# Patient Record
Sex: Male | Born: 1985 | Race: White | Hispanic: No | Marital: Single | Smoking: Current every day smoker
Health system: Southern US, Community
[De-identification: ages and names within clinical notes are randomized; demographics above are authoritative.]

## PROBLEM LIST (undated history)

## (undated) DIAGNOSIS — S0285XA Fracture of orbit, unspecified, initial encounter for closed fracture: Secondary | ICD-10-CM

## (undated) DIAGNOSIS — L0291 Cutaneous abscess, unspecified: Secondary | ICD-10-CM

## (undated) DIAGNOSIS — Z720 Tobacco use: Secondary | ICD-10-CM

## (undated) HISTORY — PX: TUMOR REMOVAL: SHX12

---
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
## 2008-11-08 ENCOUNTER — Emergency Department: Payer: Self-pay | Admitting: Internal Medicine

## 2008-12-24 ENCOUNTER — Emergency Department: Payer: Self-pay | Admitting: Emergency Medicine

## 2009-01-26 ENCOUNTER — Emergency Department: Payer: Self-pay | Admitting: Emergency Medicine

## 2009-03-03 ENCOUNTER — Emergency Department: Payer: Self-pay | Admitting: Unknown Physician Specialty

## 2009-04-29 ENCOUNTER — Emergency Department (HOSPITAL_COMMUNITY): Admission: EM | Admit: 2009-04-29 | Discharge: 2009-04-30 | Payer: Self-pay | Admitting: Emergency Medicine

## 2009-04-29 ENCOUNTER — Emergency Department: Payer: Self-pay | Admitting: Emergency Medicine

## 2009-05-15 ENCOUNTER — Emergency Department (HOSPITAL_COMMUNITY): Admission: EM | Admit: 2009-05-15 | Discharge: 2009-05-15 | Payer: Self-pay | Admitting: Emergency Medicine

## 2009-12-03 IMAGING — US US PELVIS LIMITED
1 series · 17 of 25 positions shown · non-contrast
Comparison: none

REASON FOR EXAM: SUDDEN ONSET PAIN L TESTICLE WHILE LIFTING NO HERNIA ON
EXAM
COMMENTS:

[Series 1: us pelvis limited · 17 of 48 slices shown]
[im 1/48]
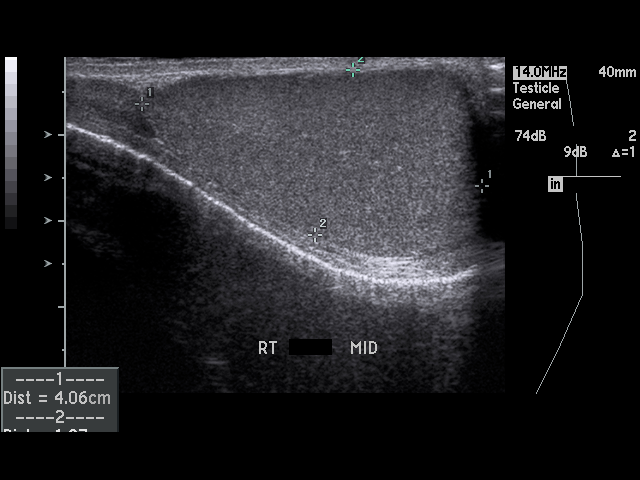
[im 4/48]
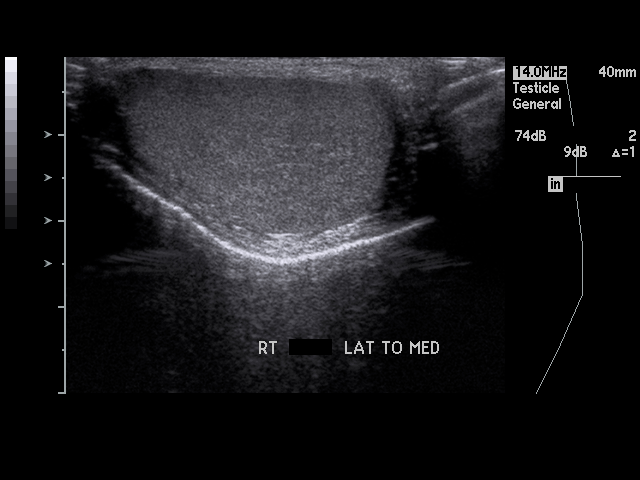
[im 6/48]
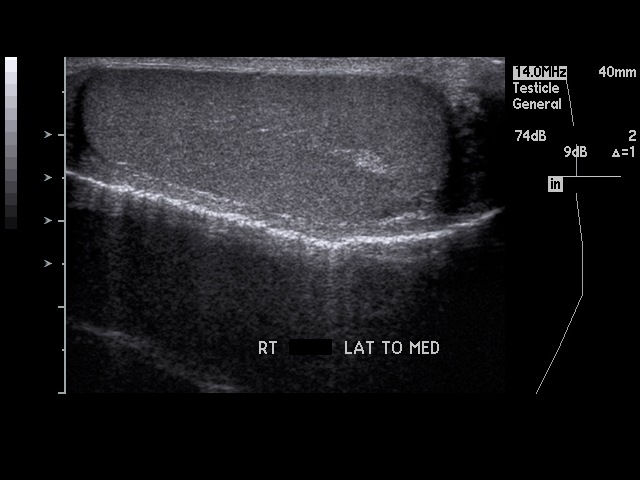
[im 10/48]
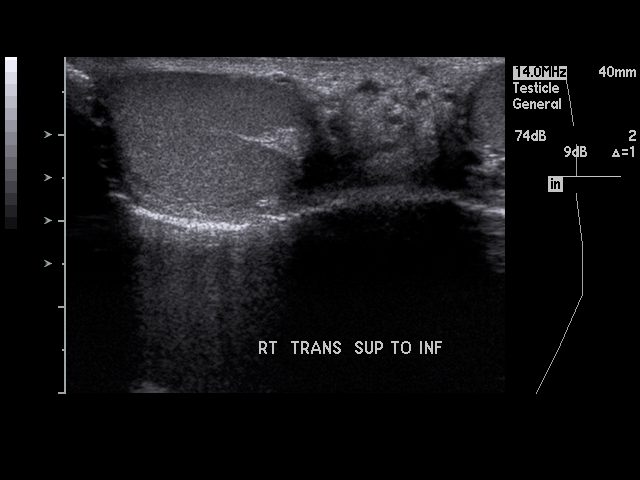
[im 12/48]
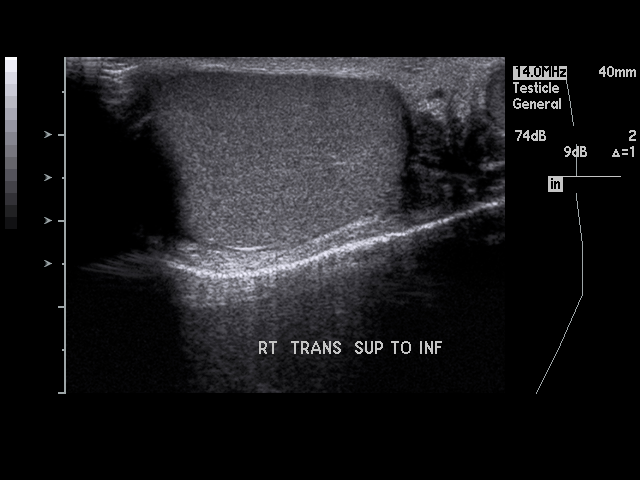
[im 16/48]
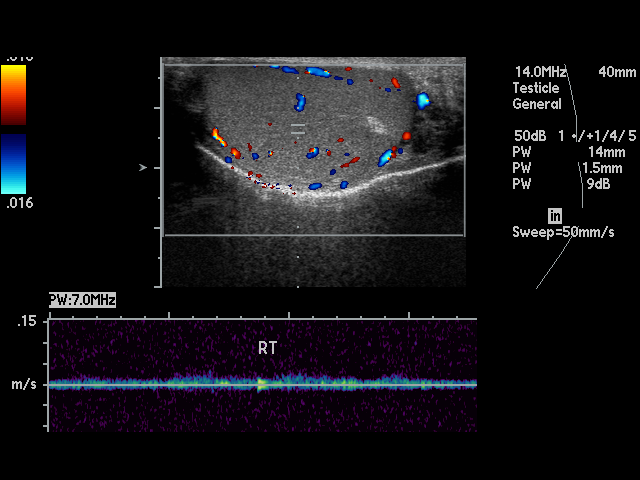
[im 18/48]
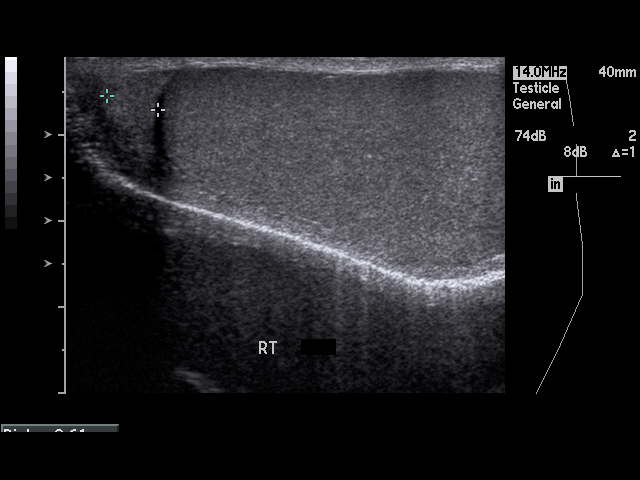
[im 22/48]
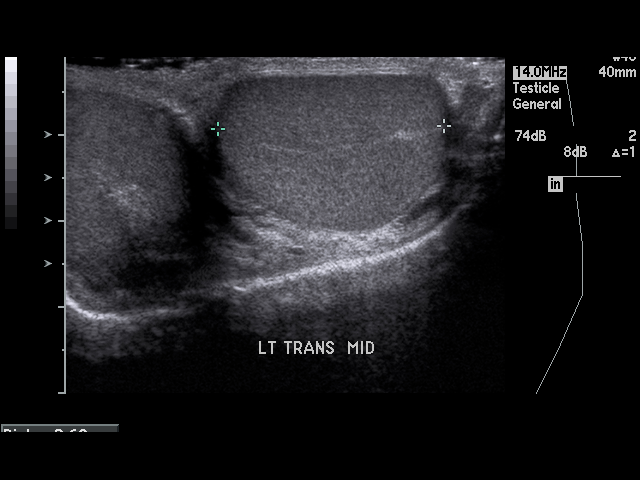
[im 24/48]
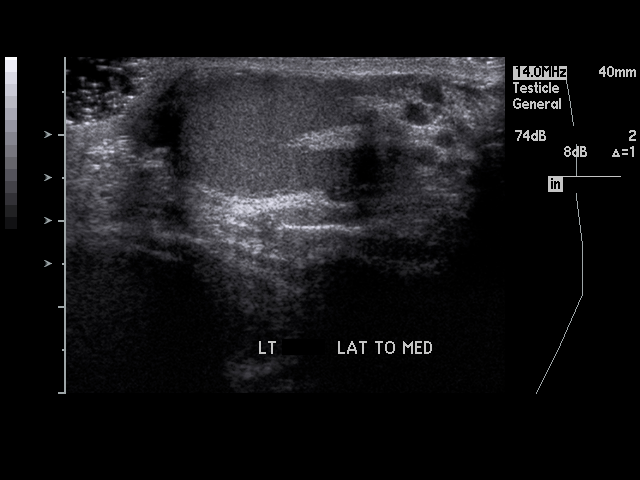
[im 26/48]
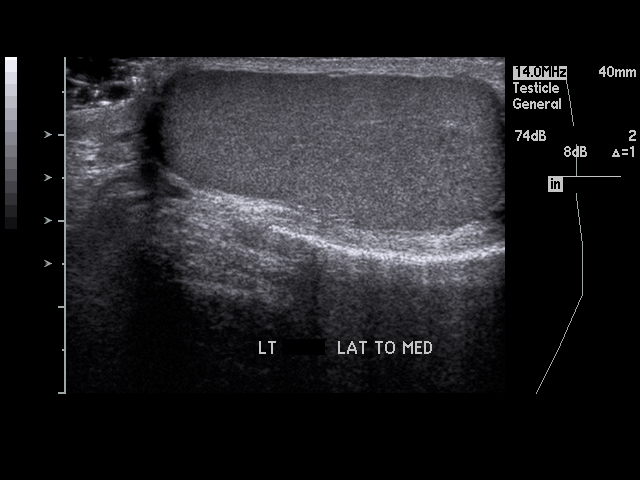
[im 30/48]
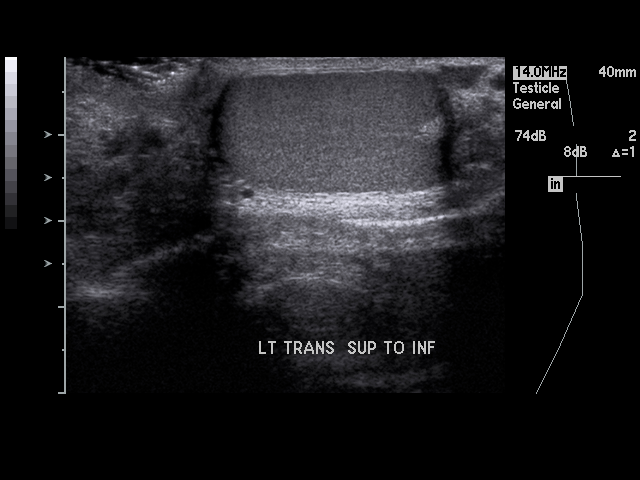
[im 32/48]
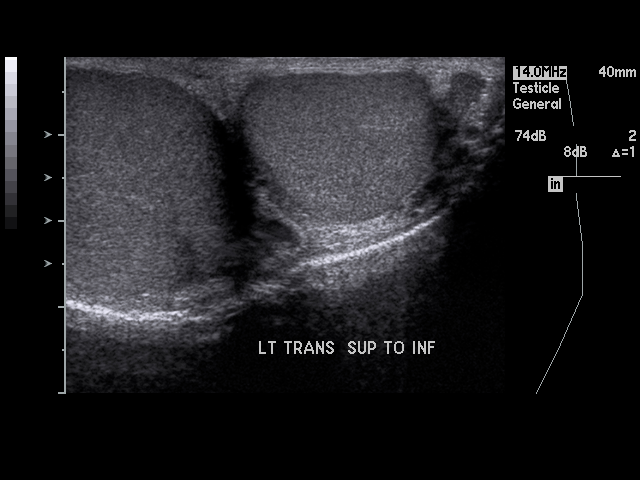
[im 36/48]
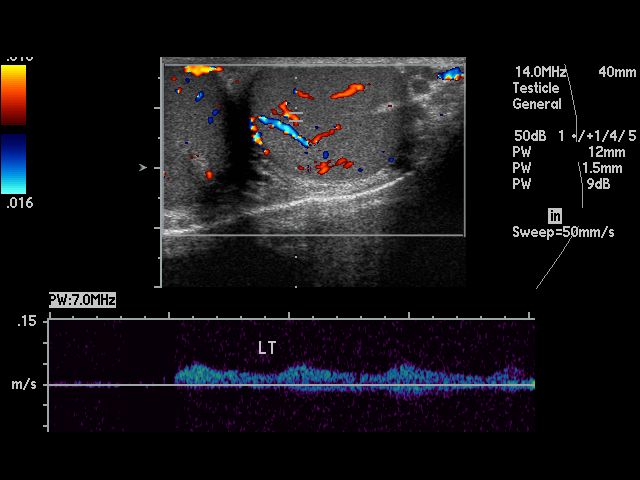
[im 38/48]
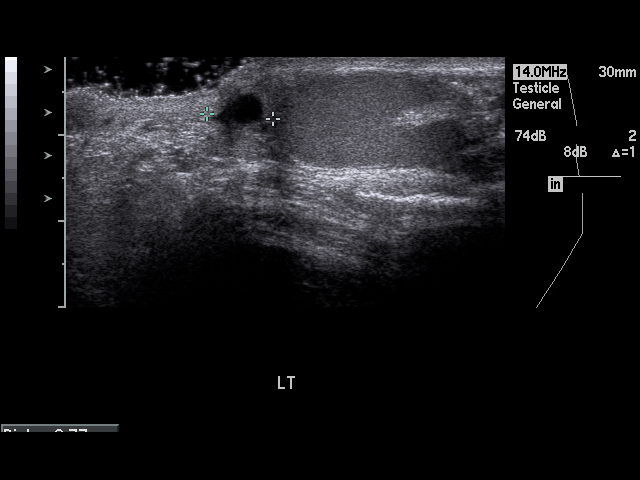
[im 42/48]
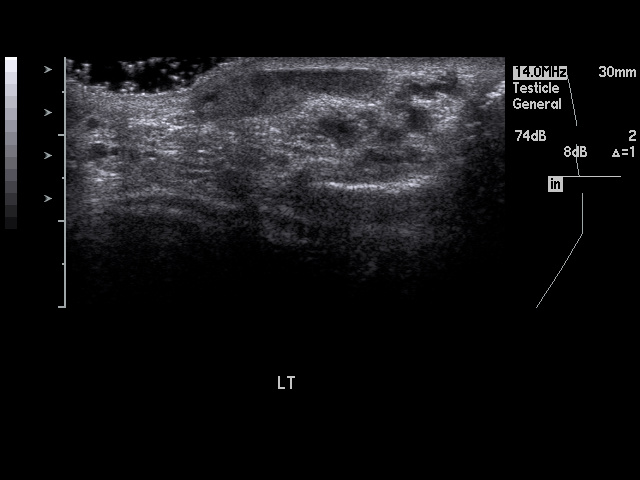
[im 44/48]
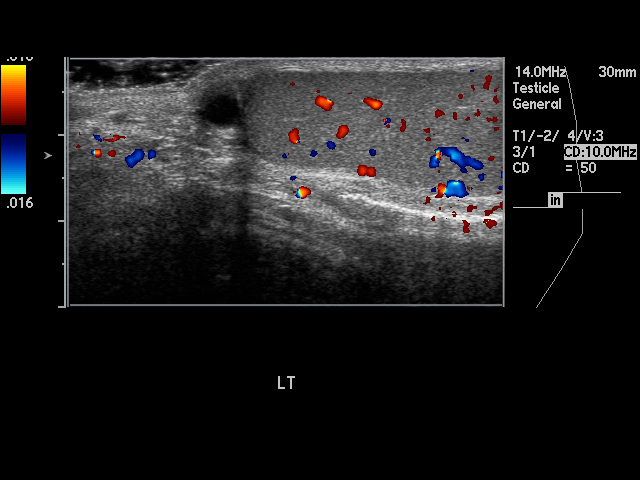
[im 48/48]
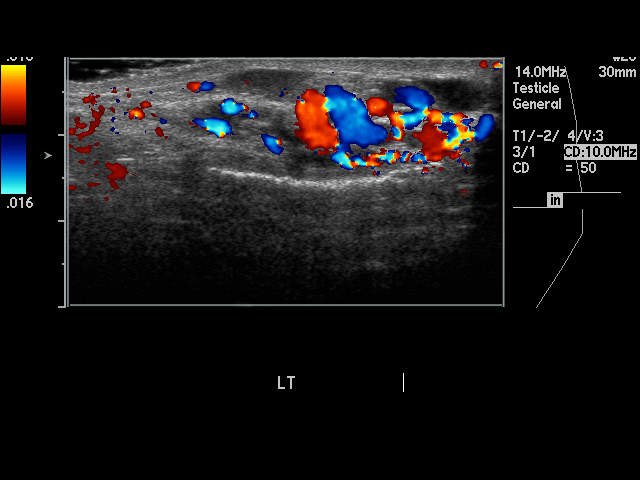

[17 of 25 positions shown; findings below may reference images not displayed]

PROCEDURE:     US  - US TESTICULAR  - January 26, 2009  [DATE]

RESULT:       Grayscale, Duplex, color flow and SPECTRAL waveform imaging
was performed of the scrotum.

Homogenous echotexture is appreciated within the right and left testicle.
The right testicle measures 4.06 x 1.97 x 3.6 cm and the left 4.34 x 1.94 x
2.63 cm.  Each demonstrates a homogenous echotexture without abnormal
sonographic foci.

The right epididymis demonstrates a homogenous echotexture and measures
mm in diameter.  The left measures 7.7 mm.  A focal anechoic nodule is
identified with increased through transmission and imperceptible wall within
the left epididymis measuring 4.3 x 3.4 x 4 mm.  This is consistent with an
epididymal cyst.  Symmetric flow is identified within the right and left
epididymis. A varicocele is identified within the left hemiscrotum.
IMPRESSION: 1.     Left epididymal cyst and left variocele.  Otherwise, unremarkable
testicular ultrasound.
2.     Dr. Airidin of the Emergency Department was informed of these
findings via preliminary fax report.

## 2010-10-16 LAB — URINALYSIS, ROUTINE W REFLEX MICROSCOPIC
Bilirubin Urine: NEGATIVE
Nitrite: NEGATIVE
Specific Gravity, Urine: 1.01 (ref 1.005–1.030)
Urobilinogen, UA: 0.2 mg/dL (ref 0.0–1.0)
pH: 6 (ref 5.0–8.0)

## 2010-10-16 LAB — POCT I-STAT, CHEM 8
Chloride: 104 mEq/L (ref 96–112)
Glucose, Bld: 89 mg/dL (ref 70–99)
HCT: 46 % (ref 39.0–52.0)
Hemoglobin: 15.6 g/dL (ref 13.0–17.0)
Potassium: 3.6 mEq/L (ref 3.5–5.1)
Sodium: 139 mEq/L (ref 135–145)

## 2011-01-07 ENCOUNTER — Emergency Department (HOSPITAL_COMMUNITY)
Admission: EM | Admit: 2011-01-07 | Discharge: 2011-01-08 | Disposition: A | Discharge status: Home / Self Care | Payer: Self-pay | Attending: Emergency Medicine | Admitting: Emergency Medicine

## 2011-01-07 DIAGNOSIS — Z79899 Other long term (current) drug therapy: Secondary | ICD-10-CM | POA: Insufficient documentation

## 2011-01-07 DIAGNOSIS — L03319 Cellulitis of trunk, unspecified: Secondary | ICD-10-CM | POA: Insufficient documentation

## 2011-01-07 DIAGNOSIS — L02219 Cutaneous abscess of trunk, unspecified: Secondary | ICD-10-CM | POA: Insufficient documentation

## 2011-03-11 ENCOUNTER — Emergency Department: Payer: Self-pay | Admitting: *Deleted

## 2011-05-16 ENCOUNTER — Emergency Department (HOSPITAL_COMMUNITY): Payer: Self-pay

## 2011-05-16 ENCOUNTER — Emergency Department (HOSPITAL_COMMUNITY)
Admission: EM | Admit: 2011-05-16 | Discharge: 2011-05-16 | Disposition: A | Discharge status: Home / Self Care | Payer: Self-pay | Attending: Emergency Medicine | Admitting: Emergency Medicine

## 2011-05-16 ENCOUNTER — Encounter: Payer: Self-pay | Admitting: *Deleted

## 2011-05-16 DIAGNOSIS — S8000XA Contusion of unspecified knee, initial encounter: Secondary | ICD-10-CM | POA: Insufficient documentation

## 2011-05-16 DIAGNOSIS — IMO0002 Reserved for concepts with insufficient information to code with codable children: Secondary | ICD-10-CM | POA: Insufficient documentation

## 2011-05-16 DIAGNOSIS — S0003XA Contusion of scalp, initial encounter: Secondary | ICD-10-CM | POA: Insufficient documentation

## 2011-05-16 DIAGNOSIS — S0230XA Fracture of orbital floor, unspecified side, initial encounter for closed fracture: Secondary | ICD-10-CM | POA: Insufficient documentation

## 2011-05-16 DIAGNOSIS — R51 Headache: Secondary | ICD-10-CM | POA: Insufficient documentation

## 2011-05-16 DIAGNOSIS — S20219A Contusion of unspecified front wall of thorax, initial encounter: Secondary | ICD-10-CM | POA: Insufficient documentation

## 2011-05-16 DIAGNOSIS — R079 Chest pain, unspecified: Secondary | ICD-10-CM | POA: Insufficient documentation

## 2011-05-16 DIAGNOSIS — F17200 Nicotine dependence, unspecified, uncomplicated: Secondary | ICD-10-CM | POA: Insufficient documentation

## 2011-05-16 DIAGNOSIS — S0285XA Fracture of orbit, unspecified, initial encounter for closed fracture: Secondary | ICD-10-CM

## 2011-05-16 DIAGNOSIS — S1093XA Contusion of unspecified part of neck, initial encounter: Secondary | ICD-10-CM | POA: Insufficient documentation

## 2011-05-16 MED ORDER — HYDROMORPHONE HCL PF 2 MG/ML IJ SOLN
2.0000 mg | Freq: Once | INTRAMUSCULAR | Status: AC
  Administered 2011-05-16: 2 mg via INTRAMUSCULAR
  Filled 2011-05-16: qty 1

## 2011-05-16 MED ORDER — AMOXICILLIN-POT CLAVULANATE 875-125 MG PO TABS: 1.0000 | ORAL_TABLET | Freq: Two times a day (BID) | ORAL | Status: AC

## 2011-05-16 MED ORDER — OXYCODONE-ACETAMINOPHEN 5-325 MG PO TABS: 2.0000 | ORAL_TABLET | ORAL | Status: AC | PRN

## 2011-05-16 NOTE — ED Notes (Signed)
Pt refused crutches after order for crutches were acknowledge. Crutches returned to floor stock.
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
## 2012-07-20 ENCOUNTER — Encounter (HOSPITAL_COMMUNITY): Payer: Self-pay | Admitting: *Deleted

## 2012-07-20 ENCOUNTER — Emergency Department (HOSPITAL_COMMUNITY)
Admission: EM | Admit: 2012-07-20 | Discharge: 2012-07-20 | Disposition: A | Discharge status: Home / Self Care | Payer: Self-pay | Attending: Emergency Medicine | Admitting: Emergency Medicine

## 2012-07-20 DIAGNOSIS — L0291 Cutaneous abscess, unspecified: Secondary | ICD-10-CM

## 2012-07-20 DIAGNOSIS — IMO0002 Reserved for concepts with insufficient information to code with codable children: Secondary | ICD-10-CM | POA: Insufficient documentation

## 2012-07-20 DIAGNOSIS — F17200 Nicotine dependence, unspecified, uncomplicated: Secondary | ICD-10-CM | POA: Insufficient documentation

## 2012-07-20 MED ORDER — MELOXICAM 7.5 MG PO TABS: 7.5000 mg | ORAL_TABLET | Freq: Every day | ORAL | Status: DC

## 2012-07-20 MED ORDER — HYDROCODONE-ACETAMINOPHEN 5-325 MG PO TABS: 1.0000 | ORAL_TABLET | ORAL | Status: AC | PRN

## 2012-07-20 MED ORDER — HYDROCODONE-ACETAMINOPHEN 5-325 MG PO TABS
1.0000 | ORAL_TABLET | Freq: Once | ORAL | Status: AC
  Administered 2012-07-20: 1 via ORAL
  Filled 2012-07-20: qty 1

## 2012-07-20 MED ORDER — KETOROLAC TROMETHAMINE 10 MG PO TABS
10.0000 mg | ORAL_TABLET | Freq: Once | ORAL | Status: AC
  Administered 2012-07-20: 10 mg via ORAL
  Filled 2012-07-20: qty 1

## 2012-07-20 MED ORDER — AMOXICILLIN 500 MG PO CAPS: 500.0000 mg | ORAL_CAPSULE | Freq: Three times a day (TID) | ORAL | Status: DC

## 2012-07-20 MED ORDER — SULFAMETHOXAZOLE-TRIMETHOPRIM 800-160 MG PO TABS: 1.0000 | ORAL_TABLET | Freq: Two times a day (BID) | ORAL | Status: DC

## 2012-07-20 MED ORDER — SULFAMETHOXAZOLE-TMP DS 800-160 MG PO TABS
1.0000 | ORAL_TABLET | Freq: Once | ORAL | Status: AC
  Administered 2012-07-20: 1 via ORAL
  Filled 2012-07-20: qty 1

## 2012-07-20 MED ORDER — CEFTRIAXONE SODIUM 1 G IJ SOLR
1.0000 g | Freq: Once | INTRAMUSCULAR | Status: AC
  Administered 2012-07-20: 1 g via INTRAMUSCULAR
  Filled 2012-07-20: qty 10

## 2012-07-20 NOTE — ED Provider Notes (Signed)
History     CSN: [id number]  Arrival date & time 07/20/12  1420   First MD Initiated Contact with Patient 07/20/12 1457      No chief complaint on file.   (Consider location/radiation/quality/duration/timing/severity/associated sxs/prior treatment) HPI Comments: Patient states that he saw a spider near his arm a few days ago. He noticed a small pimple and then it developed into a scabbed raised red area. He now has severe pain in the region. He has pain with movement of his wrist. He has not had fever or or chills related to this. He's not had nausea or vomiting related. The patient presents at this time for additional evaluation and for treatment.   History reviewed. No pertinent past medical history.  Past Surgical History  Procedure Date  . Tumor removal     right hip when he was 27yrs old    Family History  Problem Relation Age of Onset  . Diabetes Mother   . Hypertension Other   . Asthma Brother     History  Substance Use Topics  . Smoking status: Current Every Day Smoker  . Smokeless tobacco: Not on file  . Alcohol Use: Yes      Review of Systems  Constitutional: Negative for activity change.       All ROS Neg except as noted in HPI  HENT: Negative for nosebleeds and neck pain.   Eyes: Negative for photophobia and discharge.  Respiratory: Negative for cough, shortness of breath and wheezing.   Cardiovascular: Negative for chest pain and palpitations.  Gastrointestinal: Negative for abdominal pain and blood in stool.  Genitourinary: Negative for dysuria, frequency and hematuria.  Musculoskeletal: Negative for back pain and arthralgias.  Skin: Negative.   Neurological: Negative for dizziness, seizures and speech difficulty.  Psychiatric/Behavioral: Negative for hallucinations and confusion.    Allergies  Review of patient's allergies indicates no known allergies.  Home Medications   Current Outpatient Rx  Name  Route  Sig  Dispense  Refill  .  ACETAMINOPHEN 500 MG PO TABS   Oral   Take 1,000 mg by mouth every 6 (six) hours as needed. For pain           BP 129/67  Pulse 66  Temp 97.9 F (36.6 C) (Oral)  Resp 18  Ht 5\' 11"  (1.803 m)  Wt 163 lb (73.936 kg)  BMI 22.73 kg/m2  SpO2 100%  Physical Exam  Nursing note and vitals reviewed. Constitutional: He is oriented to person, place, and time. He appears well-developed and well-nourished.  Non-toxic appearance.  HENT:  Head: Normocephalic.  Right Ear: Tympanic membrane and external ear normal.  Left Ear: Tympanic membrane and external ear normal.  Eyes: EOM and lids are normal. Pupils are equal, round, and reactive to light.  Neck: Normal range of motion. Neck supple. Carotid bruit is not present.  Cardiovascular: Normal rate, regular rhythm, normal heart sounds, intact distal pulses and normal pulses.   Pulmonary/Chest: Breath sounds normal. No respiratory distress.  Abdominal: Soft. Bowel sounds are normal. There is no tenderness. There is no guarding.  Musculoskeletal:       There is a small abscess of the midline of the ulnar aspect of the forearm. There is a black scab in the Center. There is no drainage appreciated. There is a small amount of redness around this area. There is extensive soreness to palpation in the area. There is full range of motion of the fingers of the right hand, full range of  motion of the wrist, and full range of motion of the right shoulder.  Lymphadenopathy:       Head (right side): No submandibular adenopathy present.       Head (left side): No submandibular adenopathy present.    He has no cervical adenopathy.  Neurological: He is alert and oriented to person, place, and time. He has normal strength. No cranial nerve deficit or sensory deficit.  Skin: Skin is warm and dry.  Psychiatric: He has a normal mood and affect. His speech is normal.    ED Course  Procedures (including critical care time)  Labs Reviewed - No data to display No  results found.  Pulse oximetry 100% on room. Within normal limits by my interpretation. No diagnosis found.    MDM  I have reviewed nursing notes, vital signs, and all appropriate lab and imaging results for this patient. Patient noted on examination to have an abscess of the right forearm. The patient was treated in the emergency department with Rocephin and Septra. Prescription for Septra, Amoxil, Mobic, and Norco given to the patient. The patient is to use warm soaks to the area daily. He is advised to return to the emergency department if not improving.       Kathie Dike, [place] 07/20/12 [phone]

## 2012-07-20 NOTE — ED Notes (Signed)
Swelling , redness of rt forearm.

## 2012-07-22 NOTE — ED Provider Notes (Signed)
Medical screening examination/treatment/procedure(s) were performed by non-physician practitioner and as supervising physician I was immediately available for consultation/collaboration.   Lynita Groseclose L Nora Rooke, MD 07/22/12 1408 

## 2012-12-26 ENCOUNTER — Emergency Department (HOSPITAL_COMMUNITY)
Admission: EM | Admit: 2012-12-26 | Discharge: 2012-12-26 | Discharge status: Against Medical Advice | Payer: Self-pay | Attending: Emergency Medicine | Admitting: Emergency Medicine

## 2012-12-26 ENCOUNTER — Encounter (HOSPITAL_COMMUNITY): Payer: Self-pay | Admitting: *Deleted

## 2012-12-26 DIAGNOSIS — F17200 Nicotine dependence, unspecified, uncomplicated: Secondary | ICD-10-CM | POA: Insufficient documentation

## 2012-12-26 DIAGNOSIS — H5789 Other specified disorders of eye and adnexa: Secondary | ICD-10-CM | POA: Insufficient documentation

## 2012-12-26 NOTE — ED Notes (Signed)
Called for pt in waiting room, no response

## 2012-12-26 NOTE — ED Notes (Signed)
Pt states he woke up with swelling to right eye which has since resolved. Pt states swelling moves around. Symptoms x 2 weeks. NAD.

## 2012-12-26 NOTE — ED Notes (Signed)
Pt called multiple times with no response by Verlene Mayer, RN and Garrison Columbus, RN

## 2013-01-25 ENCOUNTER — Encounter (HOSPITAL_COMMUNITY): Payer: Self-pay

## 2013-01-25 DIAGNOSIS — Z79899 Other long term (current) drug therapy: Secondary | ICD-10-CM | POA: Insufficient documentation

## 2013-01-25 DIAGNOSIS — Y929 Unspecified place or not applicable: Secondary | ICD-10-CM | POA: Insufficient documentation

## 2013-01-25 DIAGNOSIS — W57XXXA Bitten or stung by nonvenomous insect and other nonvenomous arthropods, initial encounter: Secondary | ICD-10-CM | POA: Insufficient documentation

## 2013-01-25 DIAGNOSIS — L089 Local infection of the skin and subcutaneous tissue, unspecified: Secondary | ICD-10-CM | POA: Insufficient documentation

## 2013-01-25 DIAGNOSIS — Y939 Activity, unspecified: Secondary | ICD-10-CM | POA: Insufficient documentation

## 2013-01-25 DIAGNOSIS — Z791 Long term (current) use of non-steroidal anti-inflammatories (NSAID): Secondary | ICD-10-CM | POA: Insufficient documentation

## 2013-01-25 DIAGNOSIS — F17200 Nicotine dependence, unspecified, uncomplicated: Secondary | ICD-10-CM | POA: Insufficient documentation

## 2013-01-25 DIAGNOSIS — Z792 Long term (current) use of antibiotics: Secondary | ICD-10-CM | POA: Insufficient documentation

## 2013-01-25 NOTE — ED Notes (Signed)
Something bit me 3 days ago on my left elbow. Now having pain and swelling in left arm.

## 2013-01-26 ENCOUNTER — Emergency Department (HOSPITAL_COMMUNITY)
Admission: EM | Admit: 2013-01-26 | Discharge: 2013-01-26 | Disposition: A | Discharge status: Home / Self Care | Payer: Self-pay | Attending: Emergency Medicine | Admitting: Emergency Medicine

## 2013-01-26 DIAGNOSIS — L0390 Cellulitis, unspecified: Secondary | ICD-10-CM

## 2013-01-26 DIAGNOSIS — W57XXXA Bitten or stung by nonvenomous insect and other nonvenomous arthropods, initial encounter: Secondary | ICD-10-CM

## 2013-01-26 MED ORDER — IBUPROFEN 600 MG PO TABS: 600.0000 mg | ORAL_TABLET | Freq: Four times a day (QID) | ORAL | Status: DC | PRN

## 2013-01-26 MED ORDER — IBUPROFEN 800 MG PO TABS
800.0000 mg | ORAL_TABLET | Freq: Once | ORAL | Status: AC
  Administered 2013-01-26: 800 mg via ORAL
  Filled 2013-01-26: qty 1

## 2013-01-26 MED ORDER — AMOXICILLIN 500 MG PO CAPS: 500.0000 mg | ORAL_CAPSULE | Freq: Three times a day (TID) | ORAL | Status: DC

## 2013-01-26 MED ORDER — SULFAMETHOXAZOLE-TRIMETHOPRIM 800-160 MG PO TABS: 1.0000 | ORAL_TABLET | Freq: Two times a day (BID) | ORAL | Status: AC

## 2013-01-26 MED ORDER — HYDROCODONE-ACETAMINOPHEN 5-325 MG PO TABS
1.0000 | ORAL_TABLET | Freq: Once | ORAL | Status: AC
  Administered 2013-01-26: 1 via ORAL
  Filled 2013-01-26: qty 1

## 2013-01-26 MED ORDER — SULFAMETHOXAZOLE-TMP DS 800-160 MG PO TABS
1.0000 | ORAL_TABLET | Freq: Once | ORAL | Status: AC
  Administered 2013-01-26: 1 via ORAL
  Filled 2013-01-26: qty 1

## 2013-01-26 MED ORDER — HYDROCODONE-ACETAMINOPHEN 5-325 MG PO TABS: 1.0000 | ORAL_TABLET | ORAL | Status: DC | PRN

## 2013-01-26 MED ORDER — CEFTRIAXONE SODIUM 1 G IJ SOLR
1.0000 g | Freq: Once | INTRAMUSCULAR | Status: AC
  Administered 2013-01-26: 1 g via INTRAMUSCULAR
  Filled 2013-01-26: qty 10

## 2013-01-26 NOTE — ED Provider Notes (Signed)
History    CSN: [id number] Arrival date & time 01/25/13  2348  First MD Initiated Contact with Patient 01/26/13 0010     Chief Complaint  Patient presents with  . Insect Bite  . Abscess   (Consider location/radiation/quality/duration/timing/severity/associated sxs/prior Treatment) HPI Comments: Ricardo Tate is a 27 y.o. Male with increasing pain, swelling and redness of his left elbow and forearm since he was bit by a flying insect (believes it was a mosquito) 3 days ago.  It was very itchy,  He has been scratching the site and has caused spreading infection.  He has had no fevers or chills and no weakness or numbness distal to the bite site.  He has found a swollen "knot" just above the elbow.  There has been no drainage from the bite site.  He has found no alleviators of this pain.  It is constant, but worse with movement and palpation.     The history is provided by the patient.   History reviewed. No pertinent past medical history. Past Surgical History  Procedure Laterality Date  . Tumor removal      right hip when he was 27yrs old   Family History  Problem Relation Age of Onset  . Diabetes Mother   . Hypertension Other   . Asthma Brother    History  Substance Use Topics  . Smoking status: Current Every Day Smoker  . Smokeless tobacco: Not on file  . Alcohol Use: Yes    Review of Systems  Constitutional: Negative for fever and chills.  HENT: Negative for facial swelling.   Respiratory: Negative for shortness of breath and wheezing.   Cardiovascular: Negative for palpitations.  Gastrointestinal: Negative for nausea.  Musculoskeletal: Negative for arthralgias.  Skin: Positive for color change and wound.  Neurological: Negative for numbness.    Allergies  Review of patient's allergies indicates no known allergies.  Home Medications   Current Outpatient Rx  Name  Route  Sig  Dispense  Refill  . acetaminophen (TYLENOL) 500 MG tablet   Oral   Take 1,000 mg  by mouth every 6 (six) hours as needed. For pain         . amoxicillin (AMOXIL) 500 MG capsule   Oral   Take 1 capsule (500 mg total) by mouth 3 (three) times daily.   21 capsule   0   . amoxicillin (AMOXIL) 500 MG capsule   Oral   Take 1 capsule (500 mg total) by mouth 3 (three) times daily.   21 capsule   0   . HYDROcodone-acetaminophen (NORCO/VICODIN) 5-325 MG per tablet   Oral   Take 1 tablet by mouth every 4 (four) hours as needed for pain.   20 tablet   0   . ibuprofen (ADVIL,MOTRIN) 600 MG tablet   Oral   Take 1 tablet (600 mg total) by mouth every 6 (six) hours as needed for pain.   30 tablet   0   . meloxicam (MOBIC) 7.5 MG tablet   Oral   Take 1 tablet (7.5 mg total) by mouth daily.   7 tablet   0   . sulfamethoxazole-trimethoprim (BACTRIM DS,SEPTRA DS) 800-160 MG per tablet   Oral   Take 1 tablet by mouth 2 (two) times daily.   20 tablet   0   . sulfamethoxazole-trimethoprim (SEPTRA DS) 800-160 MG per tablet   Oral   Take 1 tablet by mouth every 12 (twelve) hours.   14 tablet  0    BP 136/66  Pulse 96  Temp(Src) 98.5 F (36.9 C) (Oral)  Resp 18  Ht 5\' 11"  (1.803 m)  Wt 160 lb (72.576 kg)  BMI 22.33 kg/m2  SpO2 99% Physical Exam  Constitutional: He appears well-developed and well-nourished. No distress.  HENT:  Head: Normocephalic.  Neck: Neck supple.  Cardiovascular: Normal rate.   Pulmonary/Chest: Effort normal. He has no wheezes.  Musculoskeletal: Normal range of motion. He exhibits no edema.  Skin: There is erythema.  2 cm raised erythematous plaque with central punctum at extensor surface of proximal forearm. Nondraining.  No fluctuance or induration.  Surrounding erythema 20 cm greatest length, 8 cm wide, mostly involving the medial forearm, no induration.  Radial pulse intact,  Less than 3 sec cap refill.  Pt has FROM of elbow with no increased pain.  No red streaking.  His trochlea lymph node is enlarged,  No axillary adenopathy  appreciated.    ED Course  Procedures (including critical care time) Labs Reviewed - No data to display No results found. 1. Infected insect bite   2. Cellulitis     MDM  Pt was given rocephin 1 gram IM, bactrim Ds tab, hydrocodone for pain relief.  He had increased pain at site of rocephin injection, he was given additional hydrocodone  Prior to dc home,  Wife driving home.  Advised recheck within 24 hours if sx worsened (increased redness - edges marked with skin pen) otherwise if improved, recheck in 2 days, pt agreeable to return for recheck.  There is no evidence of drainable abscess on exam,  No fluctuant pocket and minimal induration at site of bite.  Suspect staph or strep cellulitis from patient copiously scratching the site.   Burgess Amor, PA-C 01/26/13 [phone]

## 2013-01-26 NOTE — ED Provider Notes (Signed)
Medical screening examination/treatment/procedure(s) were performed by non-physician practitioner and as supervising physician I was immediately available for consultation/collaboration.   Dalyla Chui, MD 01/26/13 0259 

## 2013-04-09 ENCOUNTER — Encounter (HOSPITAL_COMMUNITY): Payer: Self-pay | Admitting: *Deleted

## 2013-04-09 ENCOUNTER — Emergency Department (HOSPITAL_COMMUNITY)
Admission: EM | Admit: 2013-04-09 | Discharge: 2013-04-09 | Disposition: A | Discharge status: Home / Self Care | Payer: Self-pay | Attending: Emergency Medicine | Admitting: Emergency Medicine

## 2013-04-09 DIAGNOSIS — L02419 Cutaneous abscess of limb, unspecified: Secondary | ICD-10-CM | POA: Insufficient documentation

## 2013-04-09 DIAGNOSIS — L0291 Cutaneous abscess, unspecified: Secondary | ICD-10-CM

## 2013-04-09 DIAGNOSIS — F17200 Nicotine dependence, unspecified, uncomplicated: Secondary | ICD-10-CM | POA: Insufficient documentation

## 2013-04-09 DIAGNOSIS — Z792 Long term (current) use of antibiotics: Secondary | ICD-10-CM | POA: Insufficient documentation

## 2013-04-09 DIAGNOSIS — R21 Rash and other nonspecific skin eruption: Secondary | ICD-10-CM | POA: Insufficient documentation

## 2013-04-09 MED ORDER — CEPHALEXIN 500 MG PO CAPS: 500.0000 mg | ORAL_CAPSULE | Freq: Four times a day (QID) | ORAL | Status: DC

## 2013-04-09 MED ORDER — HYDROCODONE-ACETAMINOPHEN 5-325 MG PO TABS
2.0000 | ORAL_TABLET | Freq: Once | ORAL | Status: AC
  Administered 2013-04-09: 2 via ORAL
  Filled 2013-04-09: qty 2

## 2013-04-09 MED ORDER — LIDOCAINE-EPINEPHRINE (PF) 2 %-1:200000 IJ SOLN
INTRAMUSCULAR | Status: AC
  Administered 2013-04-09: 20 mL
  Filled 2013-04-09: qty 20

## 2013-04-09 MED ORDER — SULFAMETHOXAZOLE-TRIMETHOPRIM 800-160 MG PO TABS: 1.0000 | ORAL_TABLET | Freq: Two times a day (BID) | ORAL | Status: DC

## 2013-04-09 NOTE — ED Notes (Signed)
Pt reporting abscess on back of right leg.  Red, swollen area noted.

## 2013-04-09 NOTE — ED Provider Notes (Signed)
Scribed for No att. providers found, the patient was seen in room APA19/APA19. This chart was scribed by Lewanda Rife, ED scribe. Patient's care was started at 2236 CSN: [id number]     Arrival date & time 04/09/13  2147 History   First MD Initiated Contact with Patient 04/09/13 2234     Chief Complaint  Patient presents with  . Recurrent Skin Infections   (Consider location/radiation/quality/duration/timing/severity/associated sxs/prior Treatment) The history is provided by the patient.   HPI Comments: RAIDON SWANNER is a 27 y.o. male who presents to the Emergency Department with a PMHx of recurrent skin infections complaining of worsening moderately painful abscess on posterior upper right leg onset 2 days. Reports associated redness and swelling. Denies any aggravating and alleviating factors.Denies associated IV drug use, fever, emesis, nausea, diarrhea, abdominal pain, chest pain, back pain, shortness of breath, numbness, and known insect bite. Denies taking any medications to relieve symptoms.  History reviewed. No pertinent past medical history. Past Surgical History  Procedure Laterality Date  . Tumor removal      right hip when he was 27yrs old   Family History  Problem Relation Age of Onset  . Diabetes Mother   . Hypertension Other   . Asthma Brother    History  Substance Use Topics  . Smoking status: Current Every Day Smoker  . Smokeless tobacco: Not on file  . Alcohol Use: Yes    Review of Systems  Constitutional: Negative for fever.  Skin: Positive for rash.  All other systems reviewed and are negative.   A complete 10 system review of systems was obtained and all systems are negative except as noted in the HPI and PMH.    Allergies  Review of patient's allergies indicates no known allergies.  Home Medications   Current Outpatient Rx  Name  Route  Sig  Dispense  Refill  . acetaminophen (TYLENOL) 500 MG tablet   Oral   Take 1,000 mg by mouth every 6  (six) hours as needed. For pain         . cephALEXin (KEFLEX) 500 MG capsule   Oral   Take 1 capsule (500 mg total) by mouth 4 (four) times daily.   40 capsule   0   . sulfamethoxazole-trimethoprim (SEPTRA DS) 800-160 MG per tablet   Oral   Take 1 tablet by mouth 2 (two) times daily.   20 tablet   0    BP 108/66  Pulse 81  Temp(Src) 97.7 F (36.5 C) (Oral)  Resp 24  Ht 5\' 11"  (1.803 m)  Wt 160 lb (72.576 kg)  BMI 22.33 kg/m2  SpO2 100% Physical Exam  Nursing note and vitals reviewed. Constitutional: He is oriented to person, place, and time. He appears well-developed and well-nourished. No distress.  HENT:  Head: Normocephalic and atraumatic.  Mouth/Throat: Oropharynx is clear and moist. No oropharyngeal exudate.  Eyes: Conjunctivae and EOM are normal.  Neck: Normal range of motion. Neck supple. No tracheal deviation present.  Cardiovascular: Normal rate, regular rhythm and normal heart sounds.   No murmur heard. Pulmonary/Chest: Effort normal and breath sounds normal. No respiratory distress.  Abdominal: Soft. There is no tenderness. There is no rebound and no guarding.  Musculoskeletal: Normal range of motion. He exhibits tenderness. He exhibits no edema.  Neurological: He is alert and oriented to person, place, and time. No cranial nerve deficit. He exhibits normal muscle tone. Coordination normal.  Skin: Skin is warm and dry. There is erythema.  1  cm indurated fluctuant lesion to right posterior thigh. 6 by 12 cm with surrounding erythema. On right posterior shoulder 1 cm scabbed ulcerated lesion   Psychiatric: He has a normal mood and affect. His behavior is normal.    ED Course  INCISION AND DRAINAGE Date/Time: 04/09/2013 11:14 PM Performed by: Glynn Octave Authorized by: Glynn Octave Consent: Verbal consent obtained. Risks and benefits: risks, benefits and alternatives were discussed Consent given by: patient Patient understanding: patient states  understanding of the procedure being performed Patient consent: the patient's understanding of the procedure matches consent given Procedure consent: procedure consent matches procedure scheduled Patient identity confirmed: verbally with patient and provided demographic data Time out: Immediately prior to procedure a "time out" was called to verify the correct patient, procedure, equipment, support staff and site/side marked as required. Type: abscess Body area: lower extremity Location details: right leg Anesthesia: local infiltration Local anesthetic: lidocaine 2% with epinephrine Anesthetic total: 6 ml Patient sedated: no Scalpel size: 11 Needle gauge: 22 Incision type: single straight Complexity: simple Drainage: purulent Drainage amount: copious Wound treatment: wound left open Packing material: 1/4 in iodoform gauze Patient tolerance: Patient tolerated the procedure well with no immediate complications.   (including critical care time). Medications  HYDROcodone-acetaminophen (NORCO/VICODIN) 5-325 MG per tablet 2 tablet (2 tablets Oral Given 04/09/13 2300)  lidocaine-EPINEPHrine (XYLOCAINE W/EPI) 2 %-1:200000 (PF) injection (20 mLs  Given 04/09/13 2301)   Labs Review Labs Reviewed - No data to display Imaging Review No results found.  MDM   1. Cellulitis and abscess    Pain and swelling to right posterior thigh for the past 2 days. No fevers. History of abscesses in the past.  Cellulitis and abscess to right posterior thigh. Incision and drainage performed. Patient will be started on antibiotics. He is instructed on warm soaks, local wound care. We'll have followup in 48 hours for abscess recheck. Return sooner with spreading redness, fevers or any other concerns.  I personally performed the services described in this documentation, which was scribed in my presence. The recorded information has been reviewed and is accurate.    Glynn Octave, MD 04/09/13 [phone]

## 2013-04-09 NOTE — ED Notes (Signed)
Pt has a boil/abcess to back of thigh. Reddness and swelling

## 2013-12-05 ENCOUNTER — Encounter (HOSPITAL_COMMUNITY): Payer: Self-pay | Admitting: Emergency Medicine

## 2013-12-05 ENCOUNTER — Emergency Department (HOSPITAL_COMMUNITY)
Admission: EM | Admit: 2013-12-05 | Discharge: 2013-12-06 | Disposition: A | Discharge status: Home / Self Care | Payer: Self-pay | Attending: Emergency Medicine | Admitting: Emergency Medicine

## 2013-12-05 ENCOUNTER — Emergency Department (HOSPITAL_COMMUNITY): Payer: Self-pay

## 2013-12-05 DIAGNOSIS — F17200 Nicotine dependence, unspecified, uncomplicated: Secondary | ICD-10-CM | POA: Insufficient documentation

## 2013-12-05 DIAGNOSIS — Z792 Long term (current) use of antibiotics: Secondary | ICD-10-CM | POA: Insufficient documentation

## 2013-12-05 DIAGNOSIS — M25562 Pain in left knee: Secondary | ICD-10-CM

## 2013-12-05 DIAGNOSIS — M25569 Pain in unspecified knee: Secondary | ICD-10-CM | POA: Insufficient documentation

## 2013-12-05 MED ORDER — OXYCODONE-ACETAMINOPHEN 5-325 MG PO TABS
2.0000 | ORAL_TABLET | Freq: Once | ORAL | Status: AC
  Administered 2013-12-06: 2 via ORAL
  Filled 2013-12-05: qty 2

## 2013-12-05 MED ORDER — OXYCODONE-ACETAMINOPHEN 5-325 MG PO TABS: 1.0000 | ORAL_TABLET | ORAL | Status: DC | PRN

## 2013-12-05 MED ORDER — NAPROXEN 500 MG PO TABS: 500.0000 mg | ORAL_TABLET | Freq: Two times a day (BID) | ORAL | Status: DC

## 2013-12-05 NOTE — ED Notes (Signed)
Pt was walking downstairs and step broke injuring left knee.

## 2013-12-06 NOTE — Discharge Instructions (Signed)
Knee Pain Knee pain can be a result of an injury or other medical conditions. Treatment will depend on the cause of your pain. HOME CARE  Only take medicine as told by your doctor.  Keep a healthy weight. Being overweight can make the knee hurt more.  Stretch before exercising or playing sports.  If there is constant knee pain, change the way you exercise. Ask your doctor for advice.  Make sure shoes fit well. Choose the right shoe for the sport or activity.  Protect your knees. Wear kneepads if needed.  Rest when you are tired. GET HELP RIGHT AWAY IF:   Your knee pain does not stop.  Your knee pain does not get better.  Your knee joint feels hot to the touch.  You have a fever. MAKE SURE YOU:   Understand these instructions.  Will watch this condition.  Will get help right away if you are not doing well or get worse. Document Released: 09/25/2008 Document Revised: 09/21/2011 Document Reviewed: 09/25/2008 [employer] Patient Information 2014 [employer].  

## 2013-12-06 NOTE — ED Notes (Signed)
Knee immobilizer applied to left knee and crutch fitting/teaching completed with return demonstration.  Discharge instructions and prescriptions given and reviewed with patient.  Patient verbalized understanding to wear brace for support, use crutches for weight bearing, sedating effects of Percocet and follow up with orthopedics.  Patient ambulatory with crutches; discharged home in good condition.

## 2013-12-07 NOTE — ED Provider Notes (Signed)
Medical screening examination/treatment/procedure(s) were performed by non-physician practitioner and as supervising physician I was immediately available for consultation/collaboration.    Vida Roller, MD 12/07/13 2255

## 2013-12-07 NOTE — ED Provider Notes (Signed)
CSN: [id number]     Arrival date & time 12/05/13  2121 History   First MD Initiated Contact with Patient 12/05/13 2345     Chief Complaint  Patient presents with  . Knee Pain     (Consider location/radiation/quality/duration/timing/severity/associated sxs/prior Treatment) Patient is a 28 y.o. male presenting with knee pain. The history is provided by the patient.  Knee Pain Location:  Knee Injury: yes   Mechanism of injury: fall   Mechanism of injury comment:  Pt reports that he was walking down steps when one of the boards broke causing him to fall.  states his left knee twisted as he fell.   Fall:    Fall occurred:  Down stairs   Impact surface:  Psychiatric nurse of impact:  Knees   Entrapped after fall: no   Knee location:  L knee Pain details:    Quality:  Aching and throbbing   Radiates to:  Does not radiate   Severity:  Severe   Onset quality:  Sudden   Timing:  Constant   Progression:  Unchanged Chronicity:  New Dislocation: no   Foreign body present:  No foreign bodies Prior injury to area:  No Relieved by:  Rest Worsened by:  Activity, bearing weight and flexion Ineffective treatments:  None tried Associated symptoms: decreased ROM and swelling   Associated symptoms: no back pain, no fever, no muscle weakness, no neck pain, no numbness, no stiffness and no tingling     History reviewed. No pertinent past medical history. Past Surgical History  Procedure Laterality Date  . Tumor removal      right hip when he was 28yrs old   Family History  Problem Relation Age of Onset  . Diabetes Mother   . Hypertension Other   . Asthma Brother    History  Substance Use Topics  . Smoking status: Current Every Day Smoker  . Smokeless tobacco: Not on file  . Alcohol Use: Yes    Review of Systems  Constitutional: Negative for fever and chills.  Genitourinary: Negative for dysuria, flank pain and difficulty urinating.  Musculoskeletal: Positive for arthralgias and  joint swelling. Negative for back pain, neck pain and stiffness.  Skin: Negative for color change and wound.  Neurological: Negative for dizziness, syncope, weakness, numbness and headaches.  All other systems reviewed and are negative.     Allergies  Review of patient's allergies indicates no known allergies.  Home Medications   Prior to Admission medications   Medication Sig Start Date End Date Taking? Authorizing Provider  acetaminophen (TYLENOL) 500 MG tablet Take 1,000 mg by mouth every 6 (six) hours as needed. For pain    Historical Provider, MD  cephALEXin (KEFLEX) 500 MG capsule Take 1 capsule (500 mg total) by mouth 4 (four) times daily. 04/09/13   Glynn Octave, MD  naproxen (NAPROSYN) 500 MG tablet Take 1 tablet (500 mg total) by mouth 2 (two) times daily. 12/05/13   Delman Goshorn L. Keelon Zurn, PA-C  oxyCODONE-acetaminophen (PERCOCET/ROXICET) 5-325 MG per tablet Take 1 tablet by mouth every 4 (four) hours as needed for severe pain. 12/05/13   Gennifer Potenza L. Charlsie Fleeger, PA-C  sulfamethoxazole-trimethoprim (SEPTRA DS) 800-160 MG per tablet Take 1 tablet by mouth 2 (two) times daily. 04/09/13   Glynn Octave, MD   BP 138/69  Pulse 108  Temp(Src) 98.2 F (36.8 C) (Oral)  Resp 17  Ht 5\' 11"  (1.803 m)  Wt 163 lb (73.936 kg)  BMI 22.74 kg/m2  SpO2 94% Physical  Exam  Nursing note and vitals reviewed. Constitutional: He is oriented to person, place, and time. He appears well-developed and well-nourished.  Uncomfortable appearing, holding left knee  HENT:  Head: Normocephalic and atraumatic.  Neck: Normal range of motion. Neck supple.  Cardiovascular: Normal rate, regular rhythm, normal heart sounds and intact distal pulses.   No murmur heard. Pulmonary/Chest: Effort normal and breath sounds normal. No respiratory distress.  Musculoskeletal: He exhibits edema and tenderness.  ttp of the medial left knee.  Mild to moderate STS.  No erythema,  or step-off deformity.  DP pulse brisk, distal  sensation intact. Calf is soft and NT. No proximal tenderness , compartments of the left leg are soft.  Neurological: He is alert and oriented to person, place, and time. He exhibits normal muscle tone. Coordination normal.  Skin: Skin is warm and dry. No erythema.    ED Course  Procedures (including critical care time) Labs Review Labs Reviewed - No data to display  Imaging Review Dg Knee Complete 4 Views Left  12/05/2013   CLINICAL DATA:  Injury to left knee, with anterior and medial left knee pain.  EXAM: LEFT KNEE - COMPLETE 4+ VIEW  COMPARISON:  Left knee radiographs performed 05/16/2011  FINDINGS: There is no evidence of fracture or dislocation. The joint spaces are preserved. No significant degenerative change is seen; the patellofemoral joint is grossly unremarkable in appearance.  A small knee joint effusion is noted. The visualized soft tissues are otherwise unremarkable in appearance.  IMPRESSION: 1. No evidence of fracture or dislocation. 2. Small knee joint effusion noted.   Electronically Signed   By: Roanna RaiderJeffery  Chang M.D.   On: 12/05/2013 22:04     EKG Interpretation None      MDM   Final diagnoses:  Knee pain, left    XR discussed with pt and possibility of ligamentous/tendon injury.  Pt agrees to crutches, immobilization and close orthopedic f/u if not improving in few days.    Knee immobilizer applied, pain improved, remains NV intact.  Naprosyn and percocet for pain.  Pt agrees to plan and appears stable for d/c.    Mckinleigh Schuchart L. Trisha Mangleriplett, PA-C 12/07/13 1851

## 2013-12-12 ENCOUNTER — Ambulatory Visit: Payer: Self-pay | Admitting: Orthopedic Surgery

## 2014-01-31 ENCOUNTER — Emergency Department (HOSPITAL_COMMUNITY)
Admission: EM | Admit: 2014-01-31 | Discharge: 2014-01-31 | Disposition: A | Discharge status: Home / Self Care | Payer: Self-pay | Attending: Emergency Medicine | Admitting: Emergency Medicine

## 2014-01-31 ENCOUNTER — Encounter (HOSPITAL_COMMUNITY): Payer: Self-pay | Admitting: Emergency Medicine

## 2014-01-31 DIAGNOSIS — Z792 Long term (current) use of antibiotics: Secondary | ICD-10-CM | POA: Insufficient documentation

## 2014-01-31 DIAGNOSIS — F17200 Nicotine dependence, unspecified, uncomplicated: Secondary | ICD-10-CM | POA: Insufficient documentation

## 2014-01-31 DIAGNOSIS — L03211 Cellulitis of face: Principal | ICD-10-CM

## 2014-01-31 DIAGNOSIS — L0201 Cutaneous abscess of face: Secondary | ICD-10-CM | POA: Insufficient documentation

## 2014-01-31 MED ORDER — HYDROCODONE-ACETAMINOPHEN 5-325 MG PO TABS: 1.0000 | ORAL_TABLET | ORAL | Status: DC | PRN

## 2014-01-31 MED ORDER — OXYCODONE-ACETAMINOPHEN 5-325 MG PO TABS
1.0000 | ORAL_TABLET | Freq: Once | ORAL | Status: AC
  Administered 2014-01-31: 1 via ORAL
  Filled 2014-01-31: qty 1

## 2014-01-31 MED ORDER — LIDOCAINE HCL (PF) 2 % IJ SOLN
2.0000 mL | Freq: Once | INTRAMUSCULAR | Status: AC
  Administered 2014-01-31: 2 mL
  Filled 2014-01-31: qty 10

## 2014-01-31 MED ORDER — SULFAMETHOXAZOLE-TRIMETHOPRIM 800-160 MG PO TABS: 2.0000 | ORAL_TABLET | Freq: Two times a day (BID) | ORAL | Status: DC

## 2014-01-31 NOTE — ED Provider Notes (Signed)
CSN: [id number]     Arrival date & time 01/31/14  1408 History   First MD Initiated Contact with Patient 01/31/14 1520     Chief Complaint  Patient presents with  . Oral Swelling     (Consider location/radiation/quality/duration/timing/severity/associated sxs/prior Treatment) The history is provided by the patient.   Ricardo Tate is a 28 y.o. male presenting with pain and swelling of his left upper lip since this morning.  He describes having a small pimple on his left upper lip which he squeezed yesterday, obtaining a small amount of white pus, followed by drainage of clear fluid.  When he woke today his lip was more painful and swollen.  He has taken ibuprofen and applied warm compresses without relief of pain or swelling.  He denies fever or chills.     History reviewed. No pertinent past medical history. Past Surgical History  Procedure Laterality Date  . Tumor removal      right hip when he was [age]yrs old   Family History  Problem Relation Age of Onset  . Diabetes Mother   . Hypertension Other   . Asthma Brother    History  Substance Use Topics  . Smoking status: Current Every Day Smoker  . Smokeless tobacco: Not on file  . Alcohol Use: Yes    Review of Systems  Constitutional: Negative for fever and chills.  HENT: Positive for facial swelling.   Respiratory: Negative for shortness of breath and wheezing.   Skin: Positive for color change and wound.  Neurological: Negative for numbness.      Allergies  Review of patient's allergies indicates no known allergies.  Home Medications   Prior to Admission medications   Medication Sig Start Date End Date Taking? Authorizing Provider  ibuprofen (ADVIL,MOTRIN) 200 MG tablet Take 400 mg by mouth every 6 (six) hours as needed.   Yes Historical Provider, MD  HYDROcodone-acetaminophen (NORCO/VICODIN) 5-325 MG per tablet Take 1 tablet by mouth every 4 (four) hours as needed. 01/31/14   Burgess AmorJulie Hanifah Royse, PA-C    sulfamethoxazole-trimethoprim (SEPTRA DS) 800-160 MG per tablet Take 2 tablets by mouth every 12 (twelve) hours. 01/31/14   Burgess AmorJulie Leshawn Straka, PA-C   BP 144/68  Pulse 68  Temp(Src) 97.9 F (36.6 C) (Oral)  Resp 18  Ht 5\' 10"  (1.778 m)  Wt 155 lb (70.308 kg)  BMI 22.24 kg/m2  SpO2 100% Physical Exam  Constitutional: He is oriented to person, place, and time. He appears well-developed and well-nourished.  HENT:  Mouth/Throat: Oropharynx is clear and moist.    Indurated papule left upper lip approximate 1 cm diameter with soft edema extending into the left lip margin and mucosa.  No nasal edema or induration. No spontaneous drainage,  Scabbing at papule site.  Cardiovascular: Normal rate.   Pulmonary/Chest: Effort normal.  Musculoskeletal: He exhibits tenderness.  Neurological: He is alert and oriented to person, place, and time. No sensory deficit.  Skin: Laceration noted.    ED Course  Procedures (including critical care time)  INCISION AND DRAINAGE Performed by: Burgess AmorIDOL, Tanyia Grabbe Consent: Verbal consent obtained. Risks and benefits: risks, benefits and alternatives were discussed Type: abscess  Body area: lip  Anesthesia: local infiltration  Incision was made with a scalpel. Small incision made with just the tip of a #11 blade, vertical.  Local anesthetic: lidocaine 2% without epinephrine  Anesthetic total: 1 ml  Complexity: complex Blunt dissection to break up loculations  Drainage: scant bloody drainage, no purulence  Drainage amount: small  Packing material: no packing  Patient tolerance: Patient tolerated the procedure well with no immediate complications.    Labs Review Labs Reviewed - No data to display  Imaging Review No results found.   EKG Interpretation None      MDM   Final diagnoses:  Abscess of face    Encouraged warm compresses, hydrocodone, septra.  Recheck tomorrow either here or pcp if not starting to improve or if he develops worse or  spreading swelling/redness/pain.  Pt understands plan. Work note given.    Burgess Amor, PA-C 01/31/14 2336

## 2014-01-31 NOTE — ED Notes (Signed)
Pt says he bursted a bump on lip yesterday and woke up this am with top lip swollen. Took 2 ibuprofen at 9am with not relief.

## 2014-01-31 NOTE — Discharge Instructions (Signed)

## 2014-01-31 NOTE — ED Notes (Addendum)
States he woke up this morning with swelling of upper lip. States he had a bump on his upper lip yesterday and after squeezing it his lip swelled up, states it really hurts

## 2014-02-01 ENCOUNTER — Emergency Department (HOSPITAL_COMMUNITY): Payer: Self-pay

## 2014-02-01 ENCOUNTER — Inpatient Hospital Stay (HOSPITAL_COMMUNITY)
Admission: EM | Admit: 2014-02-01 | Discharge: 2014-02-04 | DRG: 603 | Disposition: A | Discharge status: Home / Self Care | Payer: Self-pay | Attending: Internal Medicine | Admitting: Internal Medicine

## 2014-02-01 ENCOUNTER — Encounter (HOSPITAL_COMMUNITY): Payer: Self-pay | Admitting: Emergency Medicine

## 2014-02-01 DIAGNOSIS — F17200 Nicotine dependence, unspecified, uncomplicated: Secondary | ICD-10-CM | POA: Diagnosis present

## 2014-02-01 DIAGNOSIS — Z720 Tobacco use: Secondary | ICD-10-CM | POA: Diagnosis present

## 2014-02-01 DIAGNOSIS — R51 Headache: Secondary | ICD-10-CM | POA: Diagnosis present

## 2014-02-01 DIAGNOSIS — R509 Fever, unspecified: Secondary | ICD-10-CM | POA: Diagnosis present

## 2014-02-01 DIAGNOSIS — Z825 Family history of asthma and other chronic lower respiratory diseases: Secondary | ICD-10-CM

## 2014-02-01 DIAGNOSIS — L03211 Cellulitis of face: Secondary | ICD-10-CM | POA: Diagnosis present

## 2014-02-01 DIAGNOSIS — S0285XA Fracture of orbit, unspecified, initial encounter for closed fracture: Secondary | ICD-10-CM | POA: Insufficient documentation

## 2014-02-01 DIAGNOSIS — L0201 Cutaneous abscess of face: Principal | ICD-10-CM | POA: Diagnosis present

## 2014-02-01 DIAGNOSIS — Z833 Family history of diabetes mellitus: Secondary | ICD-10-CM

## 2014-02-01 DIAGNOSIS — K130 Diseases of lips: Secondary | ICD-10-CM | POA: Diagnosis present

## 2014-02-01 DIAGNOSIS — Z8249 Family history of ischemic heart disease and other diseases of the circulatory system: Secondary | ICD-10-CM

## 2014-02-01 DIAGNOSIS — R600 Localized edema: Secondary | ICD-10-CM | POA: Diagnosis present

## 2014-02-01 HISTORY — DX: Tobacco use: Z72.0

## 2014-02-01 HISTORY — DX: Cutaneous abscess, unspecified: L02.91

## 2014-02-01 HISTORY — DX: Fracture of orbit, unspecified, initial encounter for closed fracture: S02.85XA

## 2014-02-01 LAB — CBC WITH DIFFERENTIAL/PLATELET
Basophils Absolute: 0 10*3/uL (ref 0.0–0.1)
Basophils Relative: 0 % (ref 0–1)
Eosinophils Absolute: 0.1 10*3/uL (ref 0.0–0.7)
Eosinophils Relative: 1 % (ref 0–5)
HCT: 42.7 % (ref 39.0–52.0)
Hemoglobin: 15.2 g/dL (ref 13.0–17.0)
LYMPHS ABS: 1 10*3/uL (ref 0.7–4.0)
Lymphocytes Relative: 9 % — ABNORMAL LOW (ref 12–46)
MCH: 31.7 pg (ref 26.0–34.0)
MCHC: 35.6 g/dL (ref 30.0–36.0)
MCV: 89.1 fL (ref 78.0–100.0)
MONO ABS: 0.9 10*3/uL (ref 0.1–1.0)
Monocytes Relative: 8 % (ref 3–12)
NEUTROS ABS: 9 10*3/uL — AB (ref 1.7–7.7)
NEUTROS PCT: 82 % — AB (ref 43–77)
Platelets: 179 10*3/uL (ref 150–400)
RBC: 4.79 MIL/uL (ref 4.22–5.81)
RDW: 12.2 % (ref 11.5–15.5)
WBC: 11.1 10*3/uL — ABNORMAL HIGH (ref 4.0–10.5)

## 2014-02-01 LAB — BASIC METABOLIC PANEL
Anion gap: 11 (ref 5–15)
BUN: 8 mg/dL (ref 6–23)
CO2: 27 meq/L (ref 19–32)
CREATININE: 0.91 mg/dL (ref 0.50–1.35)
Calcium: 9.7 mg/dL (ref 8.4–10.5)
Chloride: 100 mEq/L (ref 96–112)
GFR calc Af Amer: >90 mL/min (ref >90)
GFR calc non Af Amer: >90 mL/min (ref >90)
GLUCOSE: 108 mg/dL — AB (ref 70–99)
POTASSIUM: 4.2 meq/L (ref 3.7–5.3)
Sodium: 138 mEq/L (ref 137–147)

## 2014-02-01 MED ORDER — ALUM & MAG HYDROXIDE-SIMETH 200-200-20 MG/5ML PO SUSP: 30.0000 mL | Freq: Four times a day (QID) | ORAL | Status: DC | PRN

## 2014-02-01 MED ORDER — TRAZODONE HCL 50 MG PO TABS: 25.0000 mg | ORAL_TABLET | Freq: Every evening | ORAL | Status: DC | PRN

## 2014-02-01 MED ORDER — HYDROMORPHONE HCL PF 1 MG/ML IJ SOLN
1.0000 mg | INTRAMUSCULAR | Status: DC | PRN
  Administered 2014-02-01 – 2014-02-04 (×16): 1 mg via INTRAVENOUS
  Filled 2014-02-01 (×16): qty 1

## 2014-02-01 MED ORDER — IOHEXOL 300 MG/ML  SOLN
75.0000 mL | Freq: Once | INTRAMUSCULAR | Status: AC | PRN
  Administered 2014-02-01: 75 mL via INTRAVENOUS

## 2014-02-01 MED ORDER — VANCOMYCIN HCL IN DEXTROSE 1-5 GM/200ML-% IV SOLN
1000.0000 mg | Freq: Once | INTRAVENOUS | Status: AC
  Administered 2014-02-01: 1000 mg via INTRAVENOUS
  Filled 2014-02-01: qty 200

## 2014-02-01 MED ORDER — VANCOMYCIN HCL IN DEXTROSE 1-5 GM/200ML-% IV SOLN
1000.0000 mg | Freq: Three times a day (TID) | INTRAVENOUS | Status: DC
  Administered 2014-02-01 – 2014-02-04 (×9): 1000 mg via INTRAVENOUS
  Filled 2014-02-01 (×12): qty 200

## 2014-02-01 MED ORDER — ACETAMINOPHEN 325 MG PO TABS
650.0000 mg | ORAL_TABLET | Freq: Four times a day (QID) | ORAL | Status: DC | PRN
  Administered 2014-02-02: 650 mg via ORAL
  Filled 2014-02-01: qty 2

## 2014-02-01 MED ORDER — ACETAMINOPHEN 650 MG RE SUPP: 650.0000 mg | Freq: Four times a day (QID) | RECTAL | Status: DC | PRN

## 2014-02-01 MED ORDER — NICOTINE 21 MG/24HR TD PT24
21.0000 mg | MEDICATED_PATCH | Freq: Every day | TRANSDERMAL | Status: DC
  Administered 2014-02-01 – 2014-02-02 (×2): 21 mg via TRANSDERMAL
  Filled 2014-02-01 (×3): qty 1

## 2014-02-01 MED ORDER — PIPERACILLIN-TAZOBACTAM 3.375 G IVPB 30 MIN
3.3750 g | Freq: Once | INTRAVENOUS | Status: AC
  Administered 2014-02-01: 3.375 g via INTRAVENOUS
  Filled 2014-02-01: qty 50

## 2014-02-01 MED ORDER — PIPERACILLIN-TAZOBACTAM 3.375 G IVPB
3.3750 g | Freq: Three times a day (TID) | INTRAVENOUS | Status: DC
  Administered 2014-02-01 – 2014-02-04 (×9): 3.375 g via INTRAVENOUS
  Filled 2014-02-01 (×12): qty 50

## 2014-02-01 MED ORDER — POTASSIUM CHLORIDE IN NACL 20-0.9 MEQ/L-% IV SOLN
INTRAVENOUS | Status: DC
  Administered 2014-02-01: 18:00:00 via INTRAVENOUS

## 2014-02-01 MED ORDER — HYDROMORPHONE HCL PF 1 MG/ML IJ SOLN
1.0000 mg | Freq: Once | INTRAMUSCULAR | Status: AC
  Administered 2014-02-01: 1 mg via INTRAVENOUS
  Filled 2014-02-01: qty 1

## 2014-02-01 MED ORDER — ONDANSETRON HCL 4 MG/2ML IJ SOLN: 4.0000 mg | Freq: Four times a day (QID) | INTRAMUSCULAR | Status: DC | PRN

## 2014-02-01 MED ORDER — SODIUM CHLORIDE 0.9 % IV SOLN
INTRAVENOUS | Status: DC
  Administered 2014-02-01: 10:00:00 via INTRAVENOUS

## 2014-02-01 MED ORDER — ONDANSETRON HCL 4 MG/2ML IJ SOLN
4.0000 mg | Freq: Once | INTRAMUSCULAR | Status: AC
  Administered 2014-02-01: 4 mg via INTRAVENOUS
  Filled 2014-02-01: qty 2

## 2014-02-01 NOTE — ED Provider Notes (Signed)
  This was a shared visit with a mid-level provided (NP or PA).  Throughout the patient's course I was available for consultation/collaboration.  I saw the ECG (if appropriate), relevant labs and studies - I agree with the interpretation.  On my exam the patient was in no distress.  However he had no notable swelling about the upper lip. Patient's airway was otherwise patent. CT scan did not demonstrate drainable abscess, but there was fluid collection evident. Patient started on antibiotics, admitted for further evaluation and management.      Gerhard Munchobert Samanyu Tinnell, MD 02/01/14 1344

## 2014-02-01 NOTE — ED Provider Notes (Signed)
CSN: [id number]     Arrival date & time 02/01/14  4098 History   First MD Initiated Contact with Patient 02/01/14 [phone]     Chief Complaint  Patient presents with  . Facial Swelling     (Consider location/radiation/quality/duration/timing/severity/associated sxs/prior Treatment) The history is provided by the patient.  Ricardo Tate is a 28 y.o. male who presents to the ED for recheck of facial swelling and infection. He was evaluated here yesterday and had I&D of a small abscess to the upper lip. He started Bactrim and Hydrocodone for pain. He reports that he did not sleep at all last night due to pain, fever and chills. This morning the facial swelling has increased and he reports worsening redness and pain that now extends from the upper lip to the eyes. He states that the pain is severe.   History reviewed. No pertinent past medical history. Past Surgical History  Procedure Laterality Date  . Tumor removal      right hip when he was 28yrs old   Family History  Problem Relation Age of Onset  . Diabetes Mother   . Hypertension Other   . Asthma Brother    History  Substance Use Topics  . Smoking status: Current Every Day Smoker  . Smokeless tobacco: Not on file  . Alcohol Use: Yes    Review of Systems  Constitutional: Positive for fever and chills.  HENT: Positive for facial swelling. Negative for sore throat and trouble swallowing.        Facial pain and redness  Skin: Positive for wound.  all other systems negative    Allergies  Review of patient's allergies indicates no known allergies.  Home Medications   Prior to Admission medications   Medication Sig Start Date End Date Taking? Authorizing Provider  HYDROcodone-acetaminophen (NORCO/VICODIN) 5-325 MG per tablet Take 1 tablet by mouth every 4 (four) hours as needed for moderate pain.   Yes Historical Provider, MD  sulfamethoxazole-trimethoprim (SEPTRA DS) 800-160 MG per tablet Take 2 tablets by mouth every 12  (twelve) hours. 01/31/14  Yes Raynelle Fanning Idol, PA-C  ibuprofen (ADVIL,MOTRIN) 200 MG tablet Take 400 mg by mouth every 6 (six) hours as needed.    Historical Provider, MD   BP 134/71  Pulse 80  Temp(Src) 98.4 F (36.9 C) (Oral)  Resp 18  Ht 5\' 11"  (1.803 m)  Wt 154 lb (69.854 kg)  BMI 21.49 kg/m2  SpO2 100% Physical Exam  Nursing note and vitals reviewed. Constitutional: He is oriented to person, place, and time. He appears well-developed and well-nourished. No distress.  HENT:  Mouth/Throat: Uvula is midline, oropharynx is clear and moist and mucous membranes are normal.    Swelling and erythema of the upper lip that extends to the face under the eyes. Tender on palpation. Left side of face more affected than right.   Eyes: EOM are normal.  Neck: Normal range of motion. Neck supple.  Cardiovascular: Normal rate and regular rhythm.   Pulmonary/Chest: Effort normal. No respiratory distress. He has no wheezes. He has no rales.  Abdominal: Soft. There is no tenderness.  Musculoskeletal: Normal range of motion.  Lymphadenopathy:    He has cervical adenopathy.  Neurological: He is alert and oriented to person, place, and time. No cranial nerve deficit.  Skin: Skin is warm and dry.  Psychiatric: He has a normal mood and affect. His behavior is normal.   Results for orders placed during the hospital encounter of 02/01/14 (from the past 24  hour(s))  CBC WITH DIFFERENTIAL     Status: Abnormal   Collection Time    02/01/14  9:35 AM      Result Value Ref Range   WBC 11.1 (*) 4.0 - 10.5 K/uL   RBC 4.79  4.22 - 5.81 MIL/uL   Hemoglobin 15.2  13.0 - 17.0 g/dL   HCT 16.142.7  09.639.0 - 04.552.0 %   MCV 89.1  78.0 - 100.0 fL   MCH 31.7  26.0 - 34.0 pg   MCHC 35.6  30.0 - 36.0 g/dL   RDW 40.912.2  81.111.5 - 91.415.5 %   Platelets 179  150 - 400 K/uL   Neutrophils Relative % 82 (*) 43 - 77 %   Neutro Abs 9.0 (*) 1.7 - 7.7 K/uL   Lymphocytes Relative 9 (*) 12 - 46 %   Lymphs Abs 1.0  0.7 - 4.0 K/uL   Monocytes  Relative 8  3 - 12 %   Monocytes Absolute 0.9  0.1 - 1.0 K/uL   Eosinophils Relative 1  0 - 5 %   Eosinophils Absolute 0.1  0.0 - 0.7 K/uL   Basophils Relative 0  0 - 1 %   Basophils Absolute 0.0  0.0 - 0.1 K/uL  BASIC METABOLIC PANEL     Status: Abnormal   Collection Time    02/01/14  9:35 AM      Result Value Ref Range   Sodium 138  137 - 147 mEq/L   Potassium 4.2  3.7 - 5.3 mEq/L   Chloride 100  96 - 112 mEq/L   CO2 27  19 - 32 mEq/L   Glucose, Bld 108 (*) 70 - 99 mg/dL   BUN 8  6 - 23 mg/dL   Creatinine, Ser 7.820.91  0.50 - 1.35 mg/dL   Calcium 9.7  8.4 - 95.610.5 mg/dL   GFR calc non Af Amer >90  >90 mL/min   GFR calc Af Amer >90  >90 mL/min   Anion gap 11  5 - 15    ED Course: Dr. Jeraldine LootsLockwood in to examine the patient.   Procedures  IV NS, Vancomycin, Dilaudid, Zofran, Zosyn  Consult with Dr. Sherrie MustacheFisher, she request Facial CT and then she will make decision on admission.  Ct Maxillofacial W/cm  02/01/2014   CLINICAL DATA:  Facial swelling  EXAM: CT MAXILLOFACIAL WITH CONTRAST  TECHNIQUE: Multidetector CT imaging of the maxillofacial structures was performed with intravenous contrast. Multiplanar CT image reconstructions were also generated. A small metallic BB was placed on the right temple in order to reliably differentiate right from left.  CONTRAST:  75mL OMNIPAQUE IOHEXOL 300 MG/ML  SOLN  COMPARISON:  CT face 05/16/2011  FINDINGS: Diffuse soft tissue swelling of the upper lip. Ill-defined fluid collection in the upper lip compatible with dermal abscess measuring approximately 5 mm. This is to the left of midline.  Depressed nasal bone fracture on the left has developed since the prior CT but nevertheless may be chronic.  Lucency at the base of the left upper lateral incisor has improved in the interval. Interval root canal of this tooth. This does not appear to represent the cause of the soft tissue infection.  IMPRESSION: Soft tissue swelling upper lip with ill-defined 5 mm abscess to the  left of midline.   Electronically Signed   By: Marlan Palauharles  Clark M.D.   On: 02/01/2014 11:06    @11 :12 request consult with Dr. Sherrie MustacheFisher to give results of CT. Dr. Sherrie MustacheFisher accepts patient. Temporary  admission orders written.  MDM  28 y.o. male with worsening facial swelling, pain and redness s/p I&D of upper lip yesterday. Will admit for IV antibiotics.     [facility] Orlene Och, [place] 02/01/14 1126

## 2014-02-01 NOTE — ED Provider Notes (Signed)
Medical screening examination/treatment/procedure(s) were performed by non-physician practitioner and as supervising physician I was immediately available for consultation/collaboration.   EKG Interpretation None       Raeford RazorStephen Rosaland Shiffman, MD 02/01/14 1327

## 2014-02-01 NOTE — ED Notes (Signed)
EDP at bedside  

## 2014-02-01 NOTE — Progress Notes (Addendum)
ANTIBIOTIC CONSULT NOTE - INITIAL  Pharmacy Consult for Vancomycin & Zosyn Indication: cellulitis  No Known Allergies  Patient Measurements: Height: 5\' 11"  (180.3 cm) Weight: 154 lb (69.854 kg) IBW/kg (Calculated) : 75.3  Vital Signs: Temp: 98.2 F (36.8 C) (07/23 1143) Temp src: Oral (07/23 0910) BP: 122/66 mmHg (07/23 1143) Pulse Rate: 62 (07/23 1143) Intake/Output from previous day:   Intake/Output from this shift:    Labs:  Recent Labs  02/01/14 0935  WBC 11.1*  HGB 15.2  PLT 179  CREATININE 0.91   Estimated Creatinine Clearance: 122.7 ml/min (by C-G formula based on Cr of 0.91). No results found for this basename: VANCOTROUGH, VANCOPEAK, VANCORANDOM, GENTTROUGH, GENTPEAK, GENTRANDOM, TOBRATROUGH, TOBRAPEAK, TOBRARND, AMIKACINPEAK, AMIKACINTROU, AMIKACIN,  in the last 72 hours   Microbiology: No results found for this or any previous visit (from the past 720 hour(s)).  Medical History: History reviewed. No pertinent past medical history.  Medications:  Scheduled:    Assessment: 28 yo M with cellulitis of upper lip that has worsened overnight on oral Bactrim.  He was seen in ED yesterday and I&D performed, however do not see where cx data was sent.   Patient is currently afebrile with slightly elevated WBC.   Renal function is excellent.   Vancomycin 7/23>> Zosyn 7/23>>  Goal of Therapy:  Vancomycin trough level 10-15 mcg/ml  Plan:  Zosyn 3.375gm IV Q8h to be infused over 4hrs Vancomycin 1gm IV q8h Check Vancomycin trough at steady state Monitor renal function and cx data   Elson ClanLilliston, Karime Scheuermann Michelle 02/01/2014,12:25 PM

## 2014-02-01 NOTE — H&P (Signed)
[facility] History and Physical  CODA MATHEY ZOX:[id number] DOB: 17-Jun-1986 DOA: 02/01/2014  Referring physician:  NP, Ms Damian Leavell PCP: No PCP Per Patient   Chief Complaint: Lip swelling and pain and generalized facial swelling and pain.  HPI: Ricardo Tate is a 28 y.o. male with a history of small skin infections with abscess, who presents to the emergency department with a chief complaint of worsening lip swelling and generalized facial swelling and pain. Initially, he presented to the emergency department on 01/31/14 after complaining of left upper lip swelling after he squeezed a small pimple on his upper lip. A local incision and drainage was performed in the ED. Apparently, no specimen was sent for culturing. He was discharged to home on Bactrim. However, early this morning, he developed swelling over his entire upper lip and swelling in his face which he did not have before. The pain was so intense, he had difficulty sleeping. He describes the pain as throbbing. There also developed some generalized redness over his lip and his cheek area bilaterally. He denies any purulent drainage from his lip or face. He had subjective fever and chills. He had generalized body aches. He has had a mild frontal headache. He denies photophobia, difficulty swallowing, chest pain, cough, or chest congestion/shortness of breath. He was able to swallow Bactrim tablets for which he took 3 since yesterday.   in the ED, he was afebrile and hemodynamically stable. CT scan of his face reveals soft tissue swelling of the upper lip with ill-defined 5 mm abscess to the left of midline. His lab data are significant for a WBC of 11.1. He is being admitted for further evaluation and management.     Review of Systems:  As above in history present illness, otherwise negative.  Past Medical History  Diagnosis Date  . Right orbital fracture   . Skin abscess   . Tobacco abuse    Past Surgical History  Procedure  Laterality Date  . Tumor removal      right hip when he was 28yrs old   Social History: He is single. He has a daughter. He is employed in heating and cooling. He smokes one pack of cigarettes per day. He drinks alcohol on occasion. He denies illicit drug use.  No Known Allergies  Family History  Problem Relation Age of Onset  . Diabetes Mother   . Hypertension Other   . Asthma Brother      Prior to Admission medications   Medication Sig Start Date End Date Taking? Authorizing Provider  HYDROcodone-acetaminophen (NORCO/VICODIN) 5-325 MG per tablet Take 1 tablet by mouth every 4 (four) hours as needed for moderate pain.   Yes Historical Provider, MD  sulfamethoxazole-trimethoprim (SEPTRA DS) 800-160 MG per tablet Take 2 tablets by mouth every 12 (twelve) hours. 01/31/14  Yes Raynelle Fanning Idol, PA-C  ibuprofen (ADVIL,MOTRIN) 200 MG tablet Take 400 mg by mouth every 6 (six) hours as needed.    Historical Provider, MD   Physical Exam: Filed Vitals:   02/01/14 0910 02/01/14 1143 02/01/14 1323  BP: 134/71 122/66 122/66  Pulse: 80 62 62  Temp: 98.4 F (36.9 C) 98.2 F (36.8 C) 98.2 F (36.8 C)  TempSrc: Oral  Oral  Resp: 18 16 16   Height: 5\' 11"  (1.803 m)  5\' 11"  (1.803 m)  Weight: 69.854 kg (154 lb)  69.854 kg (154 lb)  SpO2: 100% 97% 97%    Wt Readings from Last 3 Encounters:  02/01/14 69.854 kg (154 lb)  01/31/14 70.308 kg (155 lb)  12/05/13 73.936 kg (163 lb)    General: Pleasant alert 28 year old Caucasian man sitting up in bed, in no acute distress. Face: Generalized swelling and mild erythema over his maxillary area extending down to his mandibular area bilaterally. Mild tenderness to palpation. Mild warmth. Eyes: PERRL, normal lids, irises & conjunctiva ENT: His upper lip is grossly swollen with a small amount of dried blood over the left upper lip at the site of the previous I&D. No active purulent drainage. Bottom lip not swollen. Oropharynx reveals mildly dry mucous membranes  without any posterior exudates or erythema. Tympanic membrane without erythema or edema or cloudiness. Nasal mucosa is dry. Neck: Shotty cervical adenopathy palpated on the left; otherwise no  masses or thyromegaly Cardiovascular: S1, S2, no murmurs rubs or gallops. No LE edema. Telemetry: Not applicable.  Respiratory: CTA bilaterally, no w/r/r. Normal respiratory effort. Abdomen: soft, ntnd, positive bowel sounds, no hepatosplenomegaly. Skin: no rash or induration seen on limited exam; skin tattoo noted on the right upper extremity. Musculoskeletal: grossly normal tone BUE/BLE Psychiatric: grossly normal mood and affect, speech fluent and appropriate Neurologic: grossly non-focal.          Labs on Admission:  Basic Metabolic Panel:  Recent Labs Lab 02/01/14 0935  NA 138  K 4.2  CL 100  CO2 27  GLUCOSE 108*  BUN 8  CREATININE 0.91  CALCIUM 9.7   Liver Function Tests: No results found for this basename: AST, ALT, ALKPHOS, BILITOT, PROT, ALBUMIN,  in the last 168 hours No results found for this basename: LIPASE, AMYLASE,  in the last 168 hours No results found for this basename: AMMONIA,  in the last 168 hours CBC:  Recent Labs Lab 02/01/14 0935  WBC 11.1*  NEUTROABS 9.0*  HGB 15.2  HCT 42.7  MCV 89.1  PLT 179   Cardiac Enzymes: No results found for this basename: CKTOTAL, CKMB, CKMBINDEX, TROPONINI,  in the last 168 hours  BNP (last 3 results) No results found for this basename: PROBNP,  in the last 8760 hours CBG: No results found for this basename: GLUCAP,  in the last 168 hours  Radiological Exams on Admission: Ct Maxillofacial W/cm  02/01/2014   CLINICAL DATA:  Facial swelling  EXAM: CT MAXILLOFACIAL WITH CONTRAST  TECHNIQUE: Multidetector CT imaging of the maxillofacial structures was performed with intravenous contrast. Multiplanar CT image reconstructions were also generated. A small metallic BB was placed on the right temple in order to reliably  differentiate right from left.  CONTRAST:  75mL OMNIPAQUE IOHEXOL 300 MG/ML  SOLN  COMPARISON:  CT face 05/16/2011  FINDINGS: Diffuse soft tissue swelling of the upper lip. Ill-defined fluid collection in the upper lip compatible with dermal abscess measuring approximately 5 mm. This is to the left of midline.  Depressed nasal bone fracture on the left has developed since the prior CT but nevertheless may be chronic.  Lucency at the base of the left upper lateral incisor has improved in the interval. Interval root canal of this tooth. This does not appear to represent the cause of the soft tissue infection.  IMPRESSION: Soft tissue swelling upper lip with ill-defined 5 mm abscess to the left of midline.   Electronically Signed   By: Marlan Palauharles  Clark M.D.   On: 02/01/2014 11:06    EKG: Not applicable  Assessment/Plan Principal Problem:   Facial cellulitis Active Problems:   Lip abscess   Lip edema   Tobacco abuse   1. This  is a 28 year old man who presents with worsening facial cellulitis and small upper lip abscess, status post local I&D in the ED on 7/22. He presents with failed outpatient treatment. His white blood cell count is modestly elevated, but he is afebrile. Maxillofacial CT reveals no concerning tracking infection. He does not appear to be toxic, but he does seem to be uncomfortable. Apparently, there was no culture sent from the I&D in the ED.    Plan:  1. Vancomycin and Zosyn were given in the emergency department. We will continue both of these antibiotics. 2. Warm compress to the lip as tolerated. 3. Supportive treatment with gentle IV fluids, analgesics, and antiemetics as needed. 4. We'll start a full liquid diet and advance as the swelling dissipates. 5. We'll place a nicotine patch. The patient was advised to stop smoking.    Code Status: Full code DVT Prophylaxis: Early ambulation Family Communication: Discussed with grandparents were present Disposition Plan:  Discharge to home when clinically appropriate.  Time spent: One hour.  [facility] Pager [phone]  **Disclaimer: This note may have been dictated with voice recognition software. Similar sounding words can inadvertently be transcribed and this note may contain transcription errors which may not have been corrected upon publication of note.**

## 2014-02-01 NOTE — ED Notes (Addendum)
Pt reports seen for same yesterday, given po abx px. Pt reports upper lip swelling has increased. Pt alert and oriented. Airway patent. Pt reports nausea and weakness. nad noted. Moderate upper lip swelling noted in triage.

## 2014-02-02 LAB — BASIC METABOLIC PANEL
Anion gap: 9 (ref 5–15)
BUN: 5 mg/dL — ABNORMAL LOW (ref 6–23)
CO2: 30 mEq/L (ref 19–32)
CREATININE: 0.93 mg/dL (ref 0.50–1.35)
Calcium: 9.5 mg/dL (ref 8.4–10.5)
Chloride: 100 mEq/L (ref 96–112)
GFR calc non Af Amer: >90 mL/min (ref >90)
Glucose, Bld: 88 mg/dL (ref 70–99)
Potassium: 4.6 mEq/L (ref 3.7–5.3)
Sodium: 139 mEq/L (ref 137–147)

## 2014-02-02 LAB — HEPATIC FUNCTION PANEL
ALBUMIN: 3.9 g/dL (ref 3.5–5.2)
ALT: 18 U/L (ref 0–53)
AST: 20 U/L (ref 0–37)
Alkaline Phosphatase: 78 U/L (ref 39–117)
Bilirubin, Direct: <0.2 mg/dL (ref 0.0–0.3)
Total Bilirubin: 0.8 mg/dL (ref 0.3–1.2)
Total Protein: 7.1 g/dL (ref 6.0–8.3)

## 2014-02-02 LAB — CBC
HCT: 41.2 % (ref 39.0–52.0)
Hemoglobin: 14.3 g/dL (ref 13.0–17.0)
MCH: 31.8 pg (ref 26.0–34.0)
MCHC: 34.7 g/dL (ref 30.0–36.0)
MCV: 91.6 fL (ref 78.0–100.0)
Platelets: 171 10*3/uL (ref 150–400)
RBC: 4.5 MIL/uL (ref 4.22–5.81)
RDW: 12.3 % (ref 11.5–15.5)
WBC: 9.9 10*3/uL (ref 4.0–10.5)

## 2014-02-02 NOTE — Progress Notes (Signed)
UR chart review completed.  

## 2014-02-02 NOTE — Care Management Note (Signed)
    Page 1 of 1   02/02/2014     11:43:49 AM CARE MANAGEMENT NOTE 02/02/2014  Patient:  Ricardo Tate,Ricardo Tate   Account Number:  [id number]  Date Initiated:  02/02/2014  Documentation initiated by:  Sharrie RothmanBLACKWELL,Taiwan Talcott Tate  Subjective/Objective Assessment:   Pt admitted from home with cellulitis of the face. Pt lives with his grandparents and will return home at discharge. Pt is independent with ADL's and is currently working.     Action/Plan:   Financial counsleor consulted for self pay status. Pt stated that he could afford his own medication. [facility] [place].   Anticipated DC Date:  02/05/2014   Anticipated DC Plan:  HOME/SELF CARE      DC Planning Services  CM consult      Choice offered to / List presented to:             Status of service:  Completed, signed off Medicare Important Message given?   (If response is "NO", the following Medicare IM given date fields will be blank) Date Medicare IM given:   Medicare IM given by:   Date Additional Medicare IM given:   Additional Medicare IM given by:    Discharge Disposition:  HOME/SELF CARE  Per UR Regulation:    If discussed at Long Length of Stay Meetings, dates discussed:    Comments:  02/02/14 1145 Arlyss Queenammy Shamond Skelton, RN BSN CM

## 2014-02-02 NOTE — Progress Notes (Signed)
[facility] PROGRESS NOTE  Carl BestJoshua C Rotundo WUX:[id number] DOB: 02/26/1986 DOA: 02/01/2014 PCP: No PCP Per Patient    Code Status: Full code Family Communication: Discussed with patient; no family available. Disposition Plan: Discharge to home when clinically appropriate, possibly in 2 days.   Consultants:  None  Procedures:  None  Antibiotics:  Vancomycin 7/23  Zosyn 7/23  HPI/Subjective: The patient still continues to have some pain and swelling over his upper lip and left side of the face. He acknowledges that the swelling has almost resolved on the right side of his face. He wants to try solid foods and says that the full liquids are not satisfying him. He has no complaints of difficulty swallowing. He says he will chew mostly on the right side.  Objective: Filed Vitals:   02/02/14 0609  BP: 129/66  Pulse: 56  Temp: 97.5 F (36.4 C)  Resp: 20   oxygen saturation 97%.  Intake/Output Summary (Last 24 hours) at 02/02/14 1012 Last data filed at 02/02/14 0636  Gross per 24 hour  Intake 2307.5 ml  Output      0 ml  Net 2307.5 ml   Filed Weights   02/01/14 0910 02/01/14 1323  Weight: 69.854 kg (154 lb) 69.854 kg (154 lb)    Exam:   General:  28 year-old man in no acute distress.  Face: Near resolution of the generalized swelling on the right side of his face. He still has moderate swelling/edema on the left side of his face. His upper lip is still moderately swollen. Oropharynx reveals no posterior exudates, erythema, or edema.  Cardiovascular: S1, S2, with borderline bradycardia.  Respiratory: Clear to auscultation bilaterally.  Abdomen: Positive bowel sounds, soft, nontender, nondistended.  Musculoskeletal/extremities: Pedal pulses palpable. No pedal edema.   Data Reviewed: Basic Metabolic Panel:  Recent Labs Lab 02/01/14 0935 02/02/14 0501  NA 138 139  K 4.2 4.6  CL 100 100  CO2 27 30  GLUCOSE 108* 88  BUN 8 5*  CREATININE 0.91 0.93   CALCIUM 9.7 9.5   Liver Function Tests:  Recent Labs Lab 02/02/14 0501  AST 20  ALT 18  ALKPHOS 78  BILITOT 0.8  PROT 7.1  ALBUMIN 3.9   No results found for this basename: LIPASE, AMYLASE,  in the last 168 hours No results found for this basename: AMMONIA,  in the last 168 hours CBC:  Recent Labs Lab 02/01/14 0935 02/02/14 0501  WBC 11.1* 9.9  NEUTROABS 9.0*  --   HGB 15.2 14.3  HCT 42.7 41.2  MCV 89.1 91.6  PLT 179 171   Cardiac Enzymes: No results found for this basename: CKTOTAL, CKMB, CKMBINDEX, TROPONINI,  in the last 168 hours BNP (last 3 results) No results found for this basename: PROBNP,  in the last 8760 hours CBG: No results found for this basename: GLUCAP,  in the last 168 hours  No results found for this or any previous visit (from the past 240 hour(s)).   Studies: Ct Maxillofacial W/cm  02/01/2014   CLINICAL DATA:  Facial swelling  EXAM: CT MAXILLOFACIAL WITH CONTRAST  TECHNIQUE: Multidetector CT imaging of the maxillofacial structures was performed with intravenous contrast. Multiplanar CT image reconstructions were also generated. A small metallic BB was placed on the right temple in order to reliably differentiate right from left.  CONTRAST:  75mL OMNIPAQUE IOHEXOL 300 MG/ML  SOLN  COMPARISON:  CT face 05/16/2011  FINDINGS: Diffuse soft tissue swelling of the upper lip. Ill-defined fluid collection in  the upper lip compatible with dermal abscess measuring approximately 5 mm. This is to the left of midline.  Depressed nasal bone fracture on the left has developed since the prior CT but nevertheless may be chronic.  Lucency at the base of the left upper lateral incisor has improved in the interval. Interval root canal of this tooth. This does not appear to represent the cause of the soft tissue infection.  IMPRESSION: Soft tissue swelling upper lip with ill-defined 5 mm abscess to the left of midline.   Electronically Signed   By: Marlan Palau M.D.   On:  02/01/2014 11:06    Scheduled Meds: . nicotine  21 mg Transdermal Daily  . piperacillin-tazobactam (ZOSYN)  IV  3.375 g Intravenous Q8H  . vancomycin  1,000 mg Intravenous Q8H   Continuous Infusions: . 0.9 % NaCl with KCl 20 mEq / L 75 mL/hr at 02/01/14 1730   Assessment/plan: Principal Problem:   Facial cellulitis Active Problems:   Lip abscess   Lip edema   Tobacco abuse  Continue vancomycin, Zosyn, and supportive treatment. Continue warm compresses and analgesics as needed. The patient was encouraged to stop smoking. Will advance his diet to a soft diet. We'll saline lock his IV. Anticipate that the patient will need at least a couple more days of IV antibiotics.  Time spent: 25 minutes.    [facility]  [facility] Pager [phone]. If 7PM-7AM, please contact night-coverage at [email or web address], password [facility] 02/02/2014, 10:12 AM  LOS: 1 day

## 2014-02-02 NOTE — Progress Notes (Signed)
Pt has elevated temp. Administering Tylenol 650 mg now. Will re-check temp in 1 hour. Pt laying in bed at lowest position and call bell within reach.

## 2014-02-02 NOTE — Progress Notes (Signed)
Pt reported to nurse that abscess on top lip area busted and a heavy amount of puss drained from site. Nurse paged dr to see if theres anything particular to put on it for now. Temp back in normal range.Awaiting to hear from physician. Will continue to monitor pt throughout night.

## 2014-02-03 NOTE — Progress Notes (Signed)
[facility] PROGRESS NOTE  Ricardo Tate ZOX:[id number] DOB: [birth year] DOA: 02/01/2014 PCP: No PCP Per Patient    Code Status: Full code Family Communication: Discussed with patient; no family available. Disposition Plan: Discharge to home when clinically appropriate, possibly in 1-2 days.   Consultants:  None  Procedures:  None  Antibiotics:  Vancomycin 7/23  Zosyn 7/23  HPI/Subjective: Nursing reports that the top lip abscess drained a large amount of pus last night. He also ran a fever which was treated with Tylenol. He currently acknowledges is less pain and tightness in his lips and face. He is tolerating a soft diet.  Objective: Filed Vitals:   02/03/14 0504  BP: 135/59  Pulse: 86  Temp: 98.2 F (36.8 C)  Resp: 20   MAXIMUM TEMPERATURE 101.9. T. current 98.2. oxygen saturation 99%  Intake/Output Summary (Last 24 hours) at 02/03/14 1156 Last data filed at 02/02/14 2123  Gross per 24 hour  Intake    860 ml  Output      2 ml  Net    858 ml   Filed Weights   02/01/14 0910 02/01/14 1323  Weight: 69.854 kg (154 lb) 69.854 kg (154 lb)    Exam:   General:  28 year-old man in no acute distress.  Face: Near resolution of the generalized swelling on the right side of his face. Near resolution of the swelling/edema  on the left side of his face. His upper lip is still moderately swollen, but less than yesterday. There is mild to moderate tenderness over his upper lip, but no tenderness now of his face. Oropharynx reveals no posterior exudates, erythema, or edema.  Cardiovascular: S1, S2, with no murmurs rubs or gallops.Marland Kitchen  Respiratory: Clear to auscultation bilaterally.  Abdomen: Positive bowel sounds, soft, nontender, nondistended.  Musculoskeletal/extremities: Pedal pulses palpable. No pedal edema.   Data Reviewed: Basic Metabolic Panel:  Recent Labs Lab 02/01/14 0935 02/02/14 0501  NA 138 139  K 4.2 4.6  CL 100 100  CO2 27 30  GLUCOSE  108* 88  BUN 8 5*  CREATININE 0.91 0.93  CALCIUM 9.7 9.5   Liver Function Tests:  Recent Labs Lab 02/02/14 0501  AST 20  ALT 18  ALKPHOS 78  BILITOT 0.8  PROT 7.1  ALBUMIN 3.9   No results found for this basename: LIPASE, AMYLASE,  in the last 168 hours No results found for this basename: AMMONIA,  in the last 168 hours CBC:  Recent Labs Lab 02/01/14 0935 02/02/14 0501  WBC 11.1* 9.9  NEUTROABS 9.0*  --   HGB 15.2 14.3  HCT 42.7 41.2  MCV 89.1 91.6  PLT 179 171   Cardiac Enzymes: No results found for this basename: CKTOTAL, CKMB, CKMBINDEX, TROPONINI,  in the last 168 hours BNP (last 3 results) No results found for this basename: PROBNP,  in the last 8760 hours CBG: No results found for this basename: GLUCAP,  in the last 168 hours  No results found for this or any previous visit (from the past 240 hour(s)).   Studies: No results found.  Scheduled Meds: . nicotine  21 mg Transdermal Daily  . piperacillin-tazobactam (ZOSYN)  IV  3.375 g Intravenous Q8H  . vancomycin  1,000 mg Intravenous Q8H   Continuous Infusions:   Assessment/plan: Principal Problem:   Facial cellulitis Active Problems:   Lip abscess   Lip edema   Tobacco abuse  He is improving clinically and symptomatically although he did run a fever overnight. Specimen  was not collected for blood cultures which were ordered for temperature of greater than 100F.Marland Kitchen. He is currently afebrile now. The upper lip abscess came to a head and drained significantly overnight. Continue vancomycin, Zosyn, and supportive treatment. Continue warm compresses and analgesics as needed. The patient was encouraged to stop smoking.  Anticipate that the patient will need at least one to 2 more days of IV antibiotics.  Time spent: 25 minutes.    Harlingen Medical CenterFISHER,Pegeen Stiger  [facility] Pager [phone]. If 7PM-7AM, please contact night-coverage at [email or web address], password [facility] CenterRH1 02/03/2014, 11:56 AM  LOS: 2 days

## 2014-02-03 NOTE — Progress Notes (Signed)
ANTIBIOTIC CONSULT NOTE - follow up  Pharmacy Consult for Vancomycin & Zosyn Indication: cellulitis  No Known Allergies  Patient Measurements: Height: 5\' 11"  (180.3 cm) Weight: 154 lb (69.854 kg) IBW/kg (Calculated) : 75.3  Vital Signs: Temp: 98.2 F (36.8 C) (07/25 0504) Temp src: Oral (07/25 0504) BP: 135/59 mmHg (07/25 0504) Pulse Rate: 86 (07/25 0504) Intake/Output from previous day: 07/24 0701 - 07/25 0700 In: 1100 [P.O.:600; IV Piggyback:500] Out: 3 [Urine:3] Intake/Output from this shift:    Labs:  Recent Labs  02/01/14 0935 02/02/14 0501  WBC 11.1* 9.9  HGB 15.2 14.3  PLT 179 171  CREATININE 0.91 0.93   Estimated Creatinine Clearance: 120 ml/min (by C-G formula based on Cr of 0.93). No results found for this basename: VANCOTROUGH, VANCOPEAK, VANCORANDOM, GENTTROUGH, GENTPEAK, GENTRANDOM, TOBRATROUGH, TOBRAPEAK, TOBRARND, AMIKACINPEAK, AMIKACINTROU, AMIKACIN,  in the last 72 hours   Microbiology: No results found for this or any previous visit (from the past 720 hour(s)).  Medical History: Past Medical History  Diagnosis Date  . Right orbital fracture   . Skin abscess   . Tobacco abuse    Medications:  Scheduled:  . nicotine  21 mg Transdermal Daily  . piperacillin-tazobactam (ZOSYN)  IV  3.375 g Intravenous Q8H  . vancomycin  1,000 mg Intravenous Q8H   Assessment: 28 yo M with cellulitis of upper lip that has worsened overnight on oral Bactrim.  He was seen in ED yesterday and I&D performed, however do not see where cx data was sent.   Patient is currently afebrile with slightly elevated WBC.   Renal function is excellent.  Pt is improving.    Vancomycin 7/23>> Zosyn 7/23>>  Goal of Therapy:  Vancomycin trough level 10-15 mcg/ml  Plan:  Zosyn 3.375gm IV Q8h to be infused over 4hrs Vancomycin 1gm IV q8h Check Vancomycin trough IF IV abx continued > 48hrs Monitor renal function and cx data   Valrie HartHall, Mahir Prabhakar A 02/03/2014,1:24 PM

## 2014-02-04 MED ORDER — OXYCODONE-ACETAMINOPHEN 5-325 MG PO TABS: 1.0000 | ORAL_TABLET | ORAL | Status: DC | PRN

## 2014-02-04 MED ORDER — AMOXICILLIN-POT CLAVULANATE 875-125 MG PO TABS: 1.0000 | ORAL_TABLET | Freq: Two times a day (BID) | ORAL | Status: DC

## 2014-02-04 NOTE — Discharge Summary (Signed)
Physician Discharge Summary  JERIN FRANZEL ZOX:[id number] DOB: 01-23-1986 DOA: 02/01/2014  PCP: No PCP Per Patient  Admit date: 02/01/2014 Discharge date: 02/04/2014  Time spent: Greater than 30 minutes  Recommendations for Outpatient Follow-up:  1.   Discharge Diagnoses:  1. Upper lip abscess and edema with surrounding facial cellulitis. 2. Tobacco abuse. The patient was strongly advised to stop smoking.  Discharge Condition: Improved and stable.  Diet recommendation: Regular as tolerated.  Filed Weights   02/01/14 0910 02/01/14 1323  Weight: 69.854 kg (154 lb) 69.854 kg (154 lb)    History of present illness:  The patient is a 28 year old man with a history of a small skin infection with abscess in the past, who presented to the emergency department on 02/01/2014 with worsening upper lip swelling and generalized facial pain, swelling, and redness. He was initially seen in the ED on 01/31/14 for left upper lip swelling after he squeezed a small pimple on his lip. It was locally incised and drained in the emergency department. He was discharged home on Bactrim. However, the swelling, pain, and redness progressed to his face.  In the ED, he was afebrile and hemodynamically stable. CT of his face revealed soft tissue swelling of the upper lip with an ill-defined 5 mm abscess to the left of the midline. His lab data were significant for WBC of 11.1. He was admitted for further evaluation and management.  Hospital Course:   The patient was started on IV vancomycin and IV Zosyn. His pain was treated with as needed IV analgesics. Gentle IV fluids were given for 24 hours. Warm compresses were ordered and applied. He was advised to stop smoking. Tobacco cessation counseling was ordered. A nicotine patch was placed. Over the course of the hospitalization, the small abscess on his upper lip came to a head and drained a large amount of pus. He was febrile with a temperature of 101F once. Blood  cultures were ordered but not obtained. He became afebrile and remained afebrile. His white blood cell count normalized. He improved clinically and symptomatically. With exception of a small area on his left upper lip, the edema, erythema, and tenderness subsided. He was discharged on 5 more days of Augmentin. He was instructed to avoid squeezing pimples on his face and to use ample hygiene. He voiced understanding.   Procedures:  None  Consultations:  None  Discharge Exam: Filed Vitals:   02/04/14 0632  BP: 105/52  Pulse: 60  Temp: 97.7 F (36.5 C)  Resp: 12    General: No acute distress. Face: Complete resolution of the facial edema and erythema. Small amount of residual edema on the left side of the upper lip without any actual drainage. Minimal if any induration and tenderness of his upper lip. Cardiovascular:  S1, S2, with borderline bradycardia. Respiratory: Clear to auscultation bilaterally.  Discharge Instructions You were cared for by a hospitalist during your hospital stay. If you have any questions about your discharge medications or the care you received while you were in the hospital after you are discharged, you can call the unit and asked to speak with the hospitalist on call if the hospitalist that took care of you is not available. Once you are discharged, your primary care physician will handle any further medical issues. Please note that NO REFILLS for any discharge medications will be authorized once you are discharged, as it is imperative that you return to your primary care physician (or establish a relationship with a primary care  physician if you do not have one) for your aftercare needs so that they can reassess your need for medications and monitor your lab values.  Discharge Instructions   Diet general    Complete by:  As directed      Discharge instructions    Complete by:  As directed   Place warm compress to face and lip as needed.     Increase  activity slowly    Complete by:  As directed             Medication List    STOP taking these medications       HYDROcodone-acetaminophen 5-325 MG per tablet  Commonly known as:  NORCO/VICODIN     sulfamethoxazole-trimethoprim 800-160 MG per tablet  Commonly known as:  SEPTRA DS      TAKE these medications       amoxicillin-clavulanate 875-125 MG per tablet  Commonly known as:  AUGMENTIN  Take 1 tablet by mouth 2 (two) times daily. Start tomorrow.     ibuprofen 200 MG tablet  Commonly known as:  ADVIL,MOTRIN  Take 400 mg by mouth every 6 (six) hours as needed.     oxyCODONE-acetaminophen 5-325 MG per tablet  Commonly known as:  ROXICET  Take 1 tablet by mouth every 4 (four) hours as needed for severe pain.       No Known Allergies     Follow-up Information   Please follow up. (FOLLOW UP WITH YOUR PRIMARY DOCTOR AS NEEDED.)        The results of significant diagnostics from this hospitalization (including imaging, microbiology, ancillary and laboratory) are listed below for reference.    Significant Diagnostic Studies: Ct Maxillofacial W/cm  02/01/2014   CLINICAL DATA:  Facial swelling  EXAM: CT MAXILLOFACIAL WITH CONTRAST  TECHNIQUE: Multidetector CT imaging of the maxillofacial structures was performed with intravenous contrast. Multiplanar CT image reconstructions were also generated. A small metallic BB was placed on the right temple in order to reliably differentiate right from left.  CONTRAST:  75mL OMNIPAQUE IOHEXOL 300 MG/ML  SOLN  COMPARISON:  CT face 05/16/2011  FINDINGS: Diffuse soft tissue swelling of the upper lip. Ill-defined fluid collection in the upper lip compatible with dermal abscess measuring approximately 5 mm. This is to the left of midline.  Depressed nasal bone fracture on the left has developed since the prior CT but nevertheless may be chronic.  Lucency at the base of the left upper lateral incisor has improved in the interval. Interval root  canal of this tooth. This does not appear to represent the cause of the soft tissue infection.  IMPRESSION: Soft tissue swelling upper lip with ill-defined 5 mm abscess to the left of midline.   Electronically Signed   By: Marlan Palau M.D.   On: 02/01/2014 11:06    Microbiology: No results found for this or any previous visit (from the past 240 hour(s)).   Labs: Basic Metabolic Panel:  Recent Labs Lab 02/01/14 0935 02/02/14 0501  NA 138 139  K 4.2 4.6  CL 100 100  CO2 27 30  GLUCOSE 108* 88  BUN 8 5*  CREATININE 0.91 0.93  CALCIUM 9.7 9.5   Liver Function Tests:  Recent Labs Lab 02/02/14 0501  AST 20  ALT 18  ALKPHOS 78  BILITOT 0.8  PROT 7.1  ALBUMIN 3.9   No results found for this basename: LIPASE, AMYLASE,  in the last 168 hours No results found for this basename: AMMONIA,  in  the last 168 hours CBC:  Recent Labs Lab 02/01/14 0935 02/02/14 0501  WBC 11.1* 9.9  NEUTROABS 9.0*  --   HGB 15.2 14.3  HCT 42.7 41.2  MCV 89.1 91.6  PLT 179 171   Cardiac Enzymes: No results found for this basename: CKTOTAL, CKMB, CKMBINDEX, TROPONINI,  in the last 168 hours BNP: BNP (last 3 results) No results found for this basename: PROBNP,  in the last 8760 hours CBG: No results found for this basename: GLUCAP,  in the last 168 hours     Signed:  Georgio Hattabaugh  [facility] 02/04/2014, 10:39 AM

## 2014-11-15 ENCOUNTER — Emergency Department (HOSPITAL_COMMUNITY)
Admission: EM | Admit: 2014-11-15 | Discharge: 2014-11-15 | Disposition: A | Discharge status: Home / Self Care | Payer: Self-pay | Attending: Emergency Medicine | Admitting: Emergency Medicine

## 2014-11-15 ENCOUNTER — Encounter (HOSPITAL_COMMUNITY): Payer: Self-pay | Admitting: Emergency Medicine

## 2014-11-15 ENCOUNTER — Emergency Department (HOSPITAL_COMMUNITY): Payer: Self-pay

## 2014-11-15 DIAGNOSIS — X58XXXA Exposure to other specified factors, initial encounter: Secondary | ICD-10-CM | POA: Insufficient documentation

## 2014-11-15 DIAGNOSIS — Z792 Long term (current) use of antibiotics: Secondary | ICD-10-CM | POA: Insufficient documentation

## 2014-11-15 DIAGNOSIS — Z720 Tobacco use: Secondary | ICD-10-CM | POA: Insufficient documentation

## 2014-11-15 DIAGNOSIS — R52 Pain, unspecified: Secondary | ICD-10-CM

## 2014-11-15 DIAGNOSIS — Z872 Personal history of diseases of the skin and subcutaneous tissue: Secondary | ICD-10-CM | POA: Insufficient documentation

## 2014-11-15 DIAGNOSIS — Y9389 Activity, other specified: Secondary | ICD-10-CM | POA: Insufficient documentation

## 2014-11-15 DIAGNOSIS — Y990 Civilian activity done for income or pay: Secondary | ICD-10-CM | POA: Insufficient documentation

## 2014-11-15 DIAGNOSIS — S6991XA Unspecified injury of right wrist, hand and finger(s), initial encounter: Secondary | ICD-10-CM | POA: Insufficient documentation

## 2014-11-15 DIAGNOSIS — Y9289 Other specified places as the place of occurrence of the external cause: Secondary | ICD-10-CM | POA: Insufficient documentation

## 2014-11-15 MED ORDER — IBUPROFEN 800 MG PO TABS
800.0000 mg | ORAL_TABLET | Freq: Once | ORAL | Status: AC
  Administered 2014-11-15: 800 mg via ORAL
  Filled 2014-11-15: qty 1

## 2014-11-15 NOTE — ED Provider Notes (Signed)
CSN: [id number]     Arrival date & time 11/15/14  1759 History   First MD Initiated Contact with Patient 11/15/14 1905     Chief Complaint  Patient presents with  . Hand Pain     (Consider location/radiation/quality/duration/timing/severity/associated sxs/prior Treatment) HPI  [age] show male comes in today complaining of right hand pain. 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 He has some increased swelling of that overnight. He had pain with it today when he tried to work comes in secondary to that. He has no other noted injury to his hand and has not had any redness or skin injury.  Past Medical History  Diagnosis Date  . Right orbital fracture   . Skin abscess   . Tobacco abuse    Past Surgical History  Procedure Laterality Date  . 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 Diabetes Mother   . Hypertension Other   . Asthma Brother    History  Substance Use Topics  . Smoking status: Current Every Day Smoker  . Smokeless tobacco: Not on file  . Alcohol Use: Yes     Comment: occasionally    Review of Systems  All other systems reviewed and are negative.     Allergies  Review of patient's allergies indicates no known allergies.  Home Medications   Prior to Admission medications   Medication Sig Start Date End Date Taking? Authorizing Provider  amoxicillin-clavulanate (AUGMENTIN) 875-125 MG per tablet Take 1 tablet by mouth 2 (two) times daily. Start tomorrow. 02/04/14   Elliot Cousinenise Fisher, MD  ibuprofen (ADVIL,MOTRIN) 200 MG tablet Take 400 mg by mouth every 6 (six) hours as needed.    Historical Provider, MD  oxyCODONE-acetaminophen (ROXICET) 5-325 MG per tablet Take 1 tablet by mouth every 4 (four) hours as needed for severe pain. 02/04/14   Elliot Cousinenise Fisher, MD   BP 143/79 mmHg  Pulse 82  Temp(Src) 98.3 F  (36.8 C) (Oral)  Resp 22  Ht 5\' 10"  (1.778 m)  Wt 165 lb (74.844 kg)  BMI 23.68 kg/m2  SpO2 99% Physical Exam  Constitutional: He appears well-developed and well-nourished.  HENT:  Head: Normocephalic.  Neck: Normal range of motion.  Musculoskeletal:       Arms: Patient with some tenderness and swelling on the dorsal aspect of the right hand. He has full active range of motion of the wrist and fingers. 2. Sensation is intact. No tenderness over the forearm. It is slightly warm but there is no distinct erythema.  Nursing note and vitals reviewed.   ED Course  Procedures (including critical care time) Labs Review Labs Reviewed - No data to display  Imaging Review Dg Hand Complete Right  11/15/2014   CLINICAL DATA:  Was squeezing with right hand a tool yesterday and felt a pop posterior aspect at metacarpals  EXAM: RIGHT HAND - COMPLETE 3+ VIEW  COMPARISON:  None.  FINDINGS: Three views of the right hand submitted. No acute fracture or subluxation. No radiopaque foreign body. Mild dorsal metacarpal soft tissue swelling.  IMPRESSION: No acute fracture or subluxation. Mild dorsal metacarpal soft tissue swelling.   Electronically Signed   By: Natasha MeadLiviu  Pop M.D.   On: 11/15/2014 18:36     EKG Interpretation None  MDM   Final diagnoses:  Hand injury, right, initial encounter    [age] show male who had injury to his hand yesterday or popping sensation and swelling. X-Shanavia Makela reveals no evidence of fracture. Plan isthe patient in a splint and to use anti-inflammatories, cold therapy, and immobilization. He is given a note for work to be out until Monday. He is given referral to orthopedics to follow-up on Monday if he is not improved.   Margarita Grizzleanielle Yuleni Burich, MD 11/15/14 2012

## 2014-11-15 NOTE — ED Notes (Signed)
Patient states "I was squeezing wire cutters yesterday and felt a pop in the back of my right hand." Patient complaining of pain in right hand. Swelling noted to right hand at triage.

## 2014-11-15 NOTE — Discharge Instructions (Signed)
Hand Contusion ° A hand contusion is a deep bruise to the hand. Contusions happen when an injury causes bleeding under the skin. Signs of bruising include pain, puffiness (swelling), and discolored skin. The contusion may turn blue, purple, or yellow. °HOME CARE °· Put ice on the injured area. °¨ Put ice in a plastic bag. °¨ Place a towel between your skin and the bag. °¨ Leave the ice on for 15-20 minutes, 03-04 times a day. °· Only take medicines as told by your doctor. °· Use an elastic wrap only as told. You may remove the wrap for sleeping, showering, and bathing. Take the wrap off if you lose feeling (have numbness) in your fingers, or they turn blue or cold. Put the wrap on more loosely. °· Keep the hand raised (elevated) with pillows. °· Avoid using your hand too much if it is painful. °GET HELP RIGHT AWAY IF:  °· You have more redness, puffiness, or pain in your hand. °· Your puffiness or pain does not get better with medicine. °· You lose feeling in your hand, or you cannot move your fingers. °· Your hand turns cold or blue. °· You have pain when you move your fingers. °· Your hand feels warm. °· Your contusion does not get better in 2 days. °MAKE SURE YOU:  °· Understand these instructions. °· Will watch this condition. °· Will get help right away if you are not doing well or you get worse. °Document Released: 12/16/2007 Document Revised: 11/13/2013 Document Reviewed: 12/21/2011 °[employer]® Patient Information ©2015 [employer]. This information is not intended to replace advice given to you by your health care provider. Make sure you discuss any questions you have with your health care provider. ° °

## 2017-02-08 ENCOUNTER — Emergency Department (HOSPITAL_COMMUNITY): Payer: Self-pay

## 2017-02-08 ENCOUNTER — Encounter (HOSPITAL_COMMUNITY): Payer: Self-pay | Admitting: Adult Health

## 2017-02-08 ENCOUNTER — Emergency Department (HOSPITAL_COMMUNITY)
Admission: EM | Admit: 2017-02-08 | Discharge: 2017-02-08 | Disposition: A | Discharge status: Home / Self Care | Payer: Self-pay | Attending: Emergency Medicine | Admitting: Emergency Medicine

## 2017-02-08 DIAGNOSIS — F17200 Nicotine dependence, unspecified, uncomplicated: Secondary | ICD-10-CM | POA: Insufficient documentation

## 2017-02-08 DIAGNOSIS — M79602 Pain in left arm: Secondary | ICD-10-CM | POA: Insufficient documentation

## 2017-02-08 MED ORDER — KETOROLAC TROMETHAMINE 30 MG/ML IJ SOLN
15.0000 mg | Freq: Once | INTRAMUSCULAR | Status: AC
  Administered 2017-02-08: 15 mg via INTRAMUSCULAR
  Filled 2017-02-08: qty 1

## 2017-02-08 NOTE — ED Notes (Signed)
Going for xrays  

## 2017-02-08 NOTE — ED Provider Notes (Signed)
[facility] Provider Note   CSN: [id number] Arrival date & time: 02/08/17  1424     History   Chief Complaint Chief Complaint  Patient presents with  . Arm Pain    HPI Ricardo Tate is a 31 y.o. male.  HPI  Patient presents to ED for evaluation of left forearm and wrist pain that began yesterday while at work. 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 He reports some tingling and pain yesterday but has worsened after waking up this morning. States that pain is sharp and shooting with movement from his wrist up to his elbow. He denies any previous fracture, dislocations in the area. He denies any fever, swelling, temperature change, color change, nausea, vomiting, headache injury.  Past Medical History:  Diagnosis Date  . Right orbital fracture (HCC)   . Skin abscess   . Tobacco abuse     Patient Active Problem List   Diagnosis Date Noted  . Facial cellulitis 02/01/2014  . Lip edema 02/01/2014  . Tobacco abuse 02/01/2014  . Lip abscess 02/01/2014    Past Surgical History:  Procedure Laterality Date  . TUMOR REMOVAL     right hip when he was [age]yrs old       Home Medications    Prior to Admission medications   Medication Sig Start Date End Date Taking? Authorizing Provider  amoxicillin-clavulanate (AUGMENTIN) 875-125 MG per tablet Take 1 tablet by mouth 2 (two) times daily. Start tomorrow. 02/04/14   Elliot CousinFisher, Denise, MD  ibuprofen (ADVIL,MOTRIN) 200 MG tablet Take 400 mg by mouth every 6 (six) hours as needed.    [provider]  oxyCODONE-acetaminophen (ROXICET) 5-325 MG per tablet Take 1 tablet by mouth every 4 (four) hours as needed for severe pain. 02/04/14   Elliot CousinFisher, Denise, MD    Family History Family History  Problem Relation Age of Onset  . Diabetes Mother   . Hypertension Other   . Asthma Brother     Social History Social History  Substance  Use Topics  . Smoking status: Current Every Day Smoker  . Smokeless tobacco: Not on file  . Alcohol use Yes     Comment: occasionally     Allergies   Patient has no known allergies.   Review of Systems Review of Systems  Constitutional: Negative for chills and fever.  Gastrointestinal: Negative for nausea and vomiting.  Musculoskeletal: Positive for arthralgias. Negative for neck pain and neck stiffness.  Neurological: Negative for headaches.     Physical Exam Updated Vital Signs BP 130/76 (BP Location: Right Arm)   Pulse (!) 51   Temp 98.2 F (36.8 C) (Oral)   Resp 16   SpO2 98%   Physical Exam  Constitutional: He appears well-developed and well-nourished. No distress.  HENT:  Head: Normocephalic and atraumatic.  Eyes: Conjunctivae and EOM are normal. No scleral icterus.  Neck: Normal range of motion.  Pulmonary/Chest: Effort normal. No respiratory distress.  Musculoskeletal: Normal range of motion. He exhibits tenderness. He exhibits no edema or deformity.  Tenderness to palpation around the left wrist. There is no visible deformity, edema, color temperature change noted. Full active and passive range of motion of the wrist, digits and elbow. 2+ radial pulses bilaterally. Sensation intact to light touch.  Neurological: He is alert.  Skin: No rash noted. He is not diaphoretic.  Psychiatric: He has a normal mood and affect.  Nursing note and vitals reviewed.    ED Treatments / Results  Labs (all labs ordered are listed, but only abnormal results are displayed) Labs Reviewed - No data to display  EKG  EKG Interpretation None       Radiology Dg Forearm Left  Result Date: 02/08/2017 CLINICAL DATA:  Left forearm pain, crush injury EXAM: LEFT FOREARM - 2 VIEW COMPARISON:  None. FINDINGS: No fracture or dislocation is seen. The joint spaces are preserved. Visualized soft tissues are within normal limits. IMPRESSION: Negative. Electronically Signed   By: Charline BillsSriyesh   Krishnan M.D.   On: 02/08/2017 15:32   Dg Wrist Complete Left  Result Date: 02/08/2017 CLINICAL DATA:  Left forearm pain, crush injury EXAM: LEFT WRIST - COMPLETE 3+ VIEW COMPARISON:  None. FINDINGS: No fracture or dislocation is seen. The joint spaces are preserved. Visualized soft tissues are within normal limits. IMPRESSION: Negative. Electronically Signed   By: Charline BillsSriyesh  Krishnan M.D.   On: 02/08/2017 15:28    Procedures Procedures (including critical care time)  Medications Ordered in ED Medications  ketorolac (TORADOL) 30 MG/ML injection 15 mg (15 mg Intramuscular Given 02/08/17 1509)     Initial Impression / Assessment and Plan / ED Course  I have reviewed the triage vital signs and the nursing notes.  Pertinent labs & imaging results that were available during my care of the patient were reviewed by me and considered in my medical decision making (see chart for details).     Patient presents to ED for evaluation of left wrist and arm pain that occurred after injury at work. He states that his arm got caught between 2 steel beams in a carpet cutting machine. Reports pain and tingling sensation in his fingertips. On physical exam there is tenderness to palpation around the wrist but no visible deformity, edema, color temperature change noted. Has full active and passive range of motion of the digits, wrist and elbow. Sensation intact to light touch. Equal grip strength bilaterally. 2+ radial pulses bilaterally. X-rays of the wrist and forearm were negative. I have low suspicion for septic joint based on the appearance of the wrist and symptoms as well as history. He is afebrile with no history of fever. We'll give wrist brace and advised to take anti-inflammatories as needed for pain and an swelling. Patient appears stable for discharge at this time. Strict return precautions given.  Final Clinical Impressions(s) / ED Diagnoses   Final diagnoses:  Left arm pain    New  Prescriptions New Prescriptions   No medications on file     Dietrich PatesKhatri, Syenna Nazir, Cordelia Poche-C 02/08/17 1548    Benjiman CorePickering, Nathan, MD 02/09/17 [phone]

## 2017-02-08 NOTE — ED Triage Notes (Signed)
PResents with left forearm pain that began yesterday after getting armed smashed in a carpet cutter, c.o mild wrist sweling and finger tips going numb, able to move all digits well, no deformity.

## 2017-02-08 NOTE — Discharge Instructions (Signed)
Please reattach information regarding your condition. Wear wrist splint as much as possible especially while sleeping. Continue anti-inflammatories such as ibuprofen or Aleve as needed for pain and inflammation and swelling. Return to ED for any additional injury, numbness, swelling or temperature change of the joint, fevers.

## 2017-02-08 NOTE — ED Notes (Signed)
Patient given discharge instruction, verbalized understand. Patient ambulatory out of the department.  

## 2017-02-17 ENCOUNTER — Emergency Department (HOSPITAL_COMMUNITY): Payer: Self-pay

## 2017-02-17 ENCOUNTER — Encounter (HOSPITAL_COMMUNITY): Payer: Self-pay | Admitting: Cardiology

## 2017-02-17 ENCOUNTER — Emergency Department (HOSPITAL_COMMUNITY)
Admission: EM | Admit: 2017-02-17 | Discharge: 2017-02-17 | Disposition: A | Discharge status: Home / Self Care | Payer: Self-pay | Attending: Emergency Medicine | Admitting: Emergency Medicine

## 2017-02-17 DIAGNOSIS — W11XXXA Fall on and from ladder, initial encounter: Secondary | ICD-10-CM | POA: Insufficient documentation

## 2017-02-17 DIAGNOSIS — F17200 Nicotine dependence, unspecified, uncomplicated: Secondary | ICD-10-CM | POA: Insufficient documentation

## 2017-02-17 DIAGNOSIS — Y939 Activity, unspecified: Secondary | ICD-10-CM | POA: Insufficient documentation

## 2017-02-17 DIAGNOSIS — Y929 Unspecified place or not applicable: Secondary | ICD-10-CM | POA: Insufficient documentation

## 2017-02-17 DIAGNOSIS — S82302A Unspecified fracture of lower end of left tibia, initial encounter for closed fracture: Secondary | ICD-10-CM | POA: Insufficient documentation

## 2017-02-17 DIAGNOSIS — Y999 Unspecified external cause status: Secondary | ICD-10-CM | POA: Insufficient documentation

## 2017-02-17 MED ORDER — BUPIVACAINE HCL (PF) 0.25 % IJ SOLN: 20.0000 mL | Freq: Once | INTRAMUSCULAR | Status: DC

## 2017-02-17 MED ORDER — HYDROCODONE-ACETAMINOPHEN 5-325 MG PO TABS
2.0000 | ORAL_TABLET | Freq: Once | ORAL | Status: AC
  Administered 2017-02-17: 2 via ORAL
  Filled 2017-02-17: qty 2

## 2017-02-17 MED ORDER — ONDANSETRON HCL 4 MG PO TABS
4.0000 mg | ORAL_TABLET | Freq: Once | ORAL | Status: AC
  Administered 2017-02-17: 4 mg via ORAL
  Filled 2017-02-17: qty 1

## 2017-02-17 MED ORDER — IBUPROFEN 600 MG PO TABS: 600.0000 mg | ORAL_TABLET | Freq: Four times a day (QID) | ORAL | 0 refills | Status: DC

## 2017-02-17 MED ORDER — HYDROCODONE-ACETAMINOPHEN 7.5-325 MG PO TABS: 1.0000 | ORAL_TABLET | ORAL | 0 refills | Status: DC | PRN

## 2017-02-17 MED ORDER — IBUPROFEN 800 MG PO TABS
800.0000 mg | ORAL_TABLET | Freq: Once | ORAL | Status: AC
  Administered 2017-02-17: 800 mg via ORAL
  Filled 2017-02-17: qty 1

## 2017-02-17 NOTE — ED Triage Notes (Signed)
Twisted left ankle yesterday when stepped off a ladder.

## 2017-02-17 NOTE — Discharge Instructions (Signed)
Your x-ray reveals a fracture of the anterior tibial area. Please use the ankle splint, with shoe, and crutches until seen by the orthopedic specialist. Please elevate your ankle above your waist. Please apply ice. Use 600 mg of ibuprofen with breakfast, lunch, dinner, and at bedtime. May use Norco for more severe pain. Norco may cause drowsiness. Please do not drive, drink alcohol, or participate in activities requiring concentration when taking this medication.

## 2017-02-17 NOTE — ED Provider Notes (Signed)
[facility] Provider Note   CSN: [id number] Arrival date & time: 02/17/17  1103     History   Chief Complaint Chief Complaint  Patient presents with  . Ankle Pain    HPI Ricardo Tate is a 31 y.o. male.  Patient is a 31 year old male who presents to the emergency department with a complaint of left ankle pain.  The patient states that on yesterday August 7 he sustained of near fall while getting off of a ladder. He states that following this he had a most immediate pain. He had swelling of the ankle. He presents to the emergency department for evaluation, as he states that the pain is getting progressively worse.   The history is provided by the patient.    Past Medical History:  Diagnosis Date  . Right orbital fracture (HCC)   . Skin abscess   . Tobacco abuse     Patient Active Problem List   Diagnosis Date Noted  . Facial cellulitis 02/01/2014  . Lip edema 02/01/2014  . Tobacco abuse 02/01/2014  . Lip abscess 02/01/2014    Past Surgical History:  Procedure Laterality Date  . TUMOR REMOVAL     right hip when he was 31yrs old       Home Medications    Prior to Admission medications   Medication Sig Start Date End Date Taking? Authorizing Provider  amoxicillin-clavulanate (AUGMENTIN) 875-125 MG per tablet Take 1 tablet by mouth 2 (two) times daily. Start tomorrow. 02/04/14   Elliot Cousin, MD  ibuprofen (ADVIL,MOTRIN) 200 MG tablet Take 400 mg by mouth every 6 (six) hours as needed.    [provider]  oxyCODONE-acetaminophen (ROXICET) 5-325 MG per tablet Take 1 tablet by mouth every 4 (four) hours as needed for severe pain. 02/04/14   Elliot Cousin, MD    Family History Family History  Problem Relation Age of Onset  . Hypertension Other   . Diabetes Mother   . Asthma Brother     Social History Social History  Substance Use Topics  . Smoking status: Current Every Day Smoker  . Smokeless tobacco: Not on file  . Alcohol use Yes   Comment: occasionally     Allergies   Patient has no known allergies.   Review of Systems Review of Systems  Constitutional: Negative for activity change.       All ROS Neg except as noted in HPI  HENT: Negative for nosebleeds.   Eyes: Negative for photophobia and discharge.  Respiratory: Negative for cough, shortness of breath and wheezing.   Cardiovascular: Negative for chest pain and palpitations.  Gastrointestinal: Negative for abdominal pain and blood in stool.  Genitourinary: Negative for dysuria, frequency and hematuria.  Musculoskeletal: Positive for arthralgias. Negative for back pain and neck pain.  Skin: Negative.   Neurological: Negative for dizziness, seizures and speech difficulty.  Psychiatric/Behavioral: Negative for confusion and hallucinations.     Physical Exam Updated Vital Signs BP (!) 147/68 (BP Location: Right Arm)   Pulse 62   Temp 98.3 F (36.8 C) (Oral)   Resp 16   Ht 5\' 11"  (1.803 m)   Wt 77.1 kg (170 lb)   SpO2 99%   BMI 23.71 kg/m   Physical Exam  Constitutional: He is oriented to person, place, and time. He appears well-developed and well-nourished.  Non-toxic appearance.  HENT:  Head: Normocephalic.  Right Ear: Tympanic membrane and external ear normal.  Left Ear: Tympanic membrane and external ear normal.  Eyes: Pupils  are equal, round, and reactive to light. EOM and lids are normal.  Neck: Normal range of motion. Neck supple. Carotid bruit is not present.  Cardiovascular: Normal rate, regular rhythm, normal heart sounds, intact distal pulses and normal pulses.   Pulmonary/Chest: Breath sounds normal. No respiratory distress.  Abdominal: Soft. Bowel sounds are normal. There is no tenderness. There is no guarding.  Musculoskeletal: Normal range of motion.  There is full range of motion of the left hip and knee. There is pain of the lateral malleolus on the left. There is swelling of the lateral malleolus. The dorsalis pedis pulse is 2+.  The Achilles tendon is intact. Capillary refill is less than 2 seconds. There is full range of motion of the right lower extremity.  Lymphadenopathy:       Head (right side): No submandibular adenopathy present.       Head (left side): No submandibular adenopathy present.    He has no cervical adenopathy.  Neurological: He is alert and oriented to person, place, and time. He has normal strength. No cranial nerve deficit or sensory deficit.  Skin: Skin is warm and dry.  Psychiatric: He has a normal mood and affect. His speech is normal.  Nursing note and vitals reviewed.    ED Treatments / Results  Labs (all labs ordered are listed, but only abnormal results are displayed) Labs Reviewed - No data to display  EKG  EKG Interpretation None       Radiology Dg Ankle Complete Left  Result Date: 02/17/2017 CLINICAL DATA:  Twisted ankle.  Pain and swelling. EXAM: LEFT ANKLE COMPLETE - 3+ VIEW COMPARISON:  None. FINDINGS: Lateral view shows a tiny fragment adjacent to the anterior tibia at the tibiotalar joint consistent with fracture. This is associated with a joint effusion. No medial or lateral malleolar fracture. Ankle mortise is preserved. IMPRESSION: Tiny fracture fragment identified adjacent to the anterior tibia at the tibiotalar joint with associated joint effusion. Electronically Signed   By: Kennith CenterEric  Mansell M.D.   On: 02/17/2017 12:45    Procedures Procedures (including critical care time) FRACTURE CARE. Patient had a near fall from a ladder and injured his left ankle. Patient sustained a fracture at the tibiotalar joint. There was also an associated joint effusion.  I discussed with the patient the immobilization and treatment for the fracture here in the emergency department the patient is in agreement with this plan.  Patient identified by arm band.  Patient fitted with an ankle stirrup splint and postoperative shoe. Patient fitted with crutches by nursing staff. Ice pack also  provided. After application of the ankle stirrup splint, the capillary refill is less than 2 seconds. There no temperature changes. The patient states that it does not feel tight.  Prescription for ibuprofen and Norco given to the patient for pain ice pack is provided as well. Pt tolerated procedure without problem. Medications Ordered in ED Medications  HYDROcodone-acetaminophen (NORCO/VICODIN) 5-325 MG per tablet 2 tablet (not administered)  ibuprofen (ADVIL,MOTRIN) tablet 800 mg (not administered)  ondansetron (ZOFRAN) tablet 4 mg (not administered)     Initial Impression / Assessment and Plan / ED Course  I have reviewed the triage vital signs and the nursing notes.  Pertinent labs & imaging results that were available during my care of the patient were reviewed by me and considered in my medical decision making (see chart for details).       Final Clinical Impressions(s) / ED Diagnoses MDM Vital signs within normal limits.  The patient sustained a fracture of the anterior tibial area at the tibiotalar joint. The patient placed in an ankle stirrup splint and postoperative shoe. Crutches were provided. A prescription for ibuprofen and Norco was given to the patient.  The patient will follow-up with Dr. Romeo Apple for additional evaluation and management.    Final diagnoses:  Unspecified fracture of lower end of left tibia, initial encounter for closed fracture    New Prescriptions New Prescriptions   No medications on file     Ivery Quale, Cordelia Poche 02/18/17 1610    Loren Racer, MD 02/21/17 [phone]

## 2017-02-17 NOTE — ED Notes (Signed)
Patient transported to X-ray 

## 2019-01-02 ENCOUNTER — Other Ambulatory Visit: Payer: Self-pay

## 2019-01-02 ENCOUNTER — Emergency Department (HOSPITAL_COMMUNITY): Payer: Self-pay

## 2019-01-02 ENCOUNTER — Emergency Department (HOSPITAL_COMMUNITY)
Admission: EM | Admit: 2019-01-02 | Discharge: 2019-01-02 | Disposition: A | Discharge status: Home / Self Care | Payer: Self-pay | Attending: Emergency Medicine | Admitting: Emergency Medicine

## 2019-01-02 DIAGNOSIS — R51 Headache: Secondary | ICD-10-CM | POA: Insufficient documentation

## 2019-01-02 DIAGNOSIS — R110 Nausea: Secondary | ICD-10-CM | POA: Insufficient documentation

## 2019-01-02 DIAGNOSIS — R0789 Other chest pain: Secondary | ICD-10-CM | POA: Insufficient documentation

## 2019-01-02 DIAGNOSIS — F17200 Nicotine dependence, unspecified, uncomplicated: Secondary | ICD-10-CM | POA: Insufficient documentation

## 2019-01-02 DIAGNOSIS — R1011 Right upper quadrant pain: Secondary | ICD-10-CM | POA: Insufficient documentation

## 2019-01-02 LAB — CBC
HCT: 43.9 % (ref 39.0–52.0)
Hemoglobin: 14.7 g/dL (ref 13.0–17.0)
MCH: 30.2 pg (ref 26.0–34.0)
MCHC: 33.5 g/dL (ref 30.0–36.0)
MCV: 90.3 fL (ref 80.0–100.0)
Platelets: 239 10*3/uL (ref 150–400)
RBC: 4.86 MIL/uL (ref 4.22–5.81)
RDW: 12 % (ref 11.5–15.5)
WBC: 10.4 10*3/uL (ref 4.0–10.5)
nRBC: 0 % (ref 0.0–0.2)

## 2019-01-02 LAB — ETHANOL: Alcohol, Ethyl (B): 95 mg/dL — ABNORMAL HIGH (ref <10)

## 2019-01-02 LAB — COMPREHENSIVE METABOLIC PANEL
ALT: 23 U/L (ref 0–44)
AST: 31 U/L (ref 15–41)
Albumin: 4.4 g/dL (ref 3.5–5.0)
Alkaline Phosphatase: 83 U/L (ref 38–126)
Anion gap: 14 (ref 5–15)
BUN: 11 mg/dL (ref 6–20)
CO2: 22 mmol/L (ref 22–32)
Calcium: 9 mg/dL (ref 8.9–10.3)
Chloride: 104 mmol/L (ref 98–111)
Creatinine, Ser: 1.03 mg/dL (ref 0.61–1.24)
GFR calc Af Amer: >60 mL/min (ref >60)
GFR calc non Af Amer: >60 mL/min (ref >60)
Glucose, Bld: 91 mg/dL (ref 70–99)
Potassium: 4.1 mmol/L (ref 3.5–5.1)
Sodium: 140 mmol/L (ref 135–145)
Total Bilirubin: 0.7 mg/dL (ref 0.3–1.2)
Total Protein: 7 g/dL (ref 6.5–8.1)

## 2019-01-02 MED ORDER — FLUORESCEIN SODIUM 1 MG OP STRP
1.0000 | ORAL_STRIP | Freq: Once | OPHTHALMIC | Status: AC
  Administered 2019-01-02: 1 via OPHTHALMIC
  Filled 2019-01-02: qty 1

## 2019-01-02 MED ORDER — SODIUM CHLORIDE 0.9 % IV BOLUS
500.0000 mL | Freq: Once | INTRAVENOUS | Status: AC
  Administered 2019-01-02: 500 mL via INTRAVENOUS

## 2019-01-02 MED ORDER — IOHEXOL 300 MG/ML  SOLN
100.0000 mL | Freq: Once | INTRAMUSCULAR | Status: AC | PRN
  Administered 2019-01-02: 100 mL via INTRAVENOUS

## 2019-01-02 MED ORDER — NAPROXEN 500 MG PO TABS: 500.0000 mg | ORAL_TABLET | Freq: Two times a day (BID) | ORAL | 0 refills | Status: DC

## 2019-01-02 MED ORDER — TETRACAINE HCL 0.5 % OP SOLN
2.0000 [drp] | Freq: Once | OPHTHALMIC | Status: AC
  Administered 2019-01-02: 2 [drp] via OPHTHALMIC
  Filled 2019-01-02: qty 4

## 2019-01-02 MED ORDER — NAPROXEN 250 MG PO TABS
500.0000 mg | ORAL_TABLET | Freq: Once | ORAL | Status: AC
  Administered 2019-01-02: 500 mg via ORAL
  Filled 2019-01-02: qty 2

## 2019-01-02 MED ORDER — FENTANYL CITRATE (PF) 100 MCG/2ML IJ SOLN
25.0000 ug | Freq: Once | INTRAMUSCULAR | Status: AC
  Administered 2019-01-02: 25 ug via INTRAVENOUS
  Filled 2019-01-02: qty 2
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
## 2019-10-14 ENCOUNTER — Emergency Department (HOSPITAL_COMMUNITY)

## 2019-10-14 ENCOUNTER — Emergency Department (HOSPITAL_COMMUNITY)
Admission: EM | Admit: 2019-10-14 | Discharge: 2019-10-14 | Attending: Emergency Medicine | Admitting: Emergency Medicine

## 2019-10-14 ENCOUNTER — Other Ambulatory Visit: Payer: Self-pay

## 2019-10-14 ENCOUNTER — Encounter (HOSPITAL_COMMUNITY): Payer: Self-pay | Admitting: Emergency Medicine

## 2019-10-14 DIAGNOSIS — S0990XA Unspecified injury of head, initial encounter: Secondary | ICD-10-CM

## 2019-10-14 DIAGNOSIS — W010XXA Fall on same level from slipping, tripping and stumbling without subsequent striking against object, initial encounter: Secondary | ICD-10-CM | POA: Insufficient documentation

## 2019-10-14 DIAGNOSIS — Y9389 Activity, other specified: Secondary | ICD-10-CM | POA: Insufficient documentation

## 2019-10-14 DIAGNOSIS — S0101XA Laceration without foreign body of scalp, initial encounter: Secondary | ICD-10-CM | POA: Diagnosis not present

## 2019-10-14 DIAGNOSIS — Z87891 Personal history of nicotine dependence: Secondary | ICD-10-CM | POA: Insufficient documentation

## 2019-10-14 DIAGNOSIS — Y92143 Cell of prison as the place of occurrence of the external cause: Secondary | ICD-10-CM | POA: Diagnosis not present

## 2019-10-14 DIAGNOSIS — Y999 Unspecified external cause status: Secondary | ICD-10-CM | POA: Insufficient documentation

## 2019-10-14 MED ORDER — KETOROLAC TROMETHAMINE 30 MG/ML IJ SOLN
30.0000 mg | Freq: Once | INTRAMUSCULAR | Status: AC
  Administered 2019-10-14: 30 mg via INTRAMUSCULAR
  Filled 2019-10-14: qty 1

## 2019-10-14 MED ORDER — LIDOCAINE-EPINEPHRINE (PF) 2 %-1:200000 IJ SOLN
10.0000 mL | Freq: Once | INTRAMUSCULAR | Status: AC
  Administered 2019-10-14: 10 mL
  Filled 2019-10-14: qty 10

## 2019-10-14 MED ORDER — IBUPROFEN 800 MG PO TABS: 800.0000 mg | ORAL_TABLET | Freq: Three times a day (TID) | ORAL | 0 refills | Status: DC

## 2019-10-14 MED ORDER — POVIDONE-IODINE 10 % EX SOLN
CUTANEOUS | Status: DC | PRN
  Filled 2019-10-14: qty 15

## 2019-10-14 MED ORDER — LIDOCAINE-EPINEPHRINE-TETRACAINE (LET) TOPICAL GEL
3.0000 mL | Freq: Once | TOPICAL | Status: AC
  Administered 2019-10-14: 18:00:00 3 mL via TOPICAL
  Filled 2019-10-14: qty 3

## 2019-10-14 MED ORDER — TRAMADOL HCL 50 MG PO TABS
50.0000 mg | ORAL_TABLET | Freq: Once | ORAL | Status: AC
  Administered 2019-10-14: 17:00:00 50 mg via ORAL
  Filled 2019-10-14: qty 1

## 2019-10-14 NOTE — Discharge Instructions (Signed)
Have your staples removed in 10 days.  Keep your wound clean and dry,  Until a good scab forms - you may then wash gently twice daily with mild soap and water, but dry completely after.  Get rechecked for any sign of infection (redness,  Swelling,  Increased pain or drainage of purulent fluid).
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
## 2019-10-14 NOTE — ED Notes (Signed)
Pt in bed, c collar in place, pt has aprox 1-1.5 inch lac to the back of his head, pt oriented, bleeding controled/stopped at this time

## 2019-10-14 NOTE — ED Notes (Signed)
Report attempted, multiple attempts w/out answer. Discharge papers sent w/patient with instructions.

## 2020-05-10 ENCOUNTER — Other Ambulatory Visit: Payer: Self-pay

## 2020-05-10 ENCOUNTER — Encounter (HOSPITAL_COMMUNITY): Payer: Self-pay

## 2020-05-10 ENCOUNTER — Emergency Department (HOSPITAL_COMMUNITY): Payer: Self-pay

## 2020-05-10 ENCOUNTER — Emergency Department (HOSPITAL_COMMUNITY)
Admission: EM | Admit: 2020-05-10 | Discharge: 2020-05-11 | Disposition: A | Discharge status: Home / Self Care | Payer: Self-pay | Attending: Emergency Medicine | Admitting: Emergency Medicine

## 2020-05-10 DIAGNOSIS — Z20822 Contact with and (suspected) exposure to covid-19: Secondary | ICD-10-CM | POA: Insufficient documentation

## 2020-05-10 DIAGNOSIS — W25XXXA Contact with sharp glass, initial encounter: Secondary | ICD-10-CM | POA: Insufficient documentation

## 2020-05-10 DIAGNOSIS — F17210 Nicotine dependence, cigarettes, uncomplicated: Secondary | ICD-10-CM | POA: Insufficient documentation

## 2020-05-10 DIAGNOSIS — S56221A Laceration of other flexor muscle, fascia and tendon at forearm level, right arm, initial encounter: Secondary | ICD-10-CM | POA: Insufficient documentation

## 2020-05-10 DIAGNOSIS — Y92009 Unspecified place in unspecified non-institutional (private) residence as the place of occurrence of the external cause: Secondary | ICD-10-CM | POA: Insufficient documentation

## 2020-05-10 MED ORDER — CEFAZOLIN SODIUM 1 G IJ SOLR
1.0000 g | Freq: Once | INTRAMUSCULAR | Status: AC
  Administered 2020-05-10: 1 g via INTRAMUSCULAR
  Filled 2020-05-10: qty 10

## 2020-05-10 MED ORDER — KETOROLAC TROMETHAMINE 30 MG/ML IJ SOLN
30.0000 mg | Freq: Once | INTRAMUSCULAR | Status: AC
  Administered 2020-05-10: 30 mg via INTRAMUSCULAR
  Filled 2020-05-10: qty 1

## 2020-05-10 NOTE — ED Provider Notes (Signed)
[facility] EMERGENCY DEPARTMENT Provider Note   CSN: [id number] Arrival date & time: 05/10/20  2248   Time seen 11:35 PM  History Chief Complaint  Patient presents with   Extremity Laceration    Ricardo Tate is a 34 y.o. male.  HPI   Patient states just prior to arrival he was outside standing on a chair changing a window pain in a window of his house and the chair started sinking into the ground and he fell forward and his arm went through a window pain and broke it.  He denies any numbness of his fingers.  Patient is right-handed.  He states he had to walk about a half a mile to his uncle's house who brought him to the ED.  He states his last tetanus was about 5 years ago.  PCP Patient, No Pcp Per   Past Medical History:  Diagnosis Date   Right orbital fracture (HCC)    Skin abscess    Tobacco abuse     Patient Active Problem List   Diagnosis Date Noted   Facial cellulitis 02/01/2014   Lip edema 02/01/2014   Tobacco abuse 02/01/2014   Lip abscess 02/01/2014    Past Surgical History:  Procedure Laterality Date   TUMOR REMOVAL     right hip when he was 34yrs old       Family History  Problem Relation Age of Onset   Hypertension Other    Diabetes Mother    Asthma Brother     Social History   Tobacco Use   Smoking status: Current Every Day Smoker    Packs/day: 0.50    Types: Cigarettes   Smokeless tobacco: Never Used  Substance Use Topics   Alcohol use: Yes    Comment: occasionally   Drug use: No    Home Medications Prior to Admission medications   Medication Sig Start Date End Date Taking? Authorizing Provider  ibuprofen (ADVIL) 800 MG tablet Take 1 tablet (800 mg total) by mouth 3 (three) times daily. 10/14/19   Burgess Amor, PA-C    Allergies    Patient has no known allergies.  Review of Systems   Review of Systems  All other systems reviewed and are negative.   Physical Exam Updated Vital Signs BP 139/89 (BP Location:  Left Arm)    Pulse 84    Temp 98.3 F (36.8 C) (Oral)    Resp 18    Ht 5\' 11"  (1.803 m)    Wt 71.2 kg    SpO2 99%    BMI 21.90 kg/m   Physical Exam Vitals and nursing note reviewed.  Constitutional:      General: He is not in acute distress.    Appearance: Normal appearance. He is normal weight.  HENT:     Head: Normocephalic and atraumatic.  Eyes:     Extraocular Movements: Extraocular movements intact.     Conjunctiva/sclera: Conjunctivae normal.  Cardiovascular:     Rate and Rhythm: Normal rate.  Pulmonary:     Effort: Pulmonary effort is normal. No respiratory distress.  Musculoskeletal:     Cervical back: Normal range of motion.     Comments: Patient has a 7 cm linear laceration over the volar aspect of the mid right forearm.  There is a obvious lacerated tendon hanging out of the wound on the radial aspect.  When I do range of motion of his index middle and ring finger I can see that the muscles are lacerated and the  muscle for the ring fingers appears to be almost completely severed.  Skin:    General: Skin is warm and dry.  Neurological:     General: No focal deficit present.     Mental Status: He is alert and oriented to person, place, and time.     Cranial Nerves: No cranial nerve deficit.     Sensory: Sensory deficit:   Psychiatric:        Mood and Affect: Mood normal.        Behavior: Behavior normal.        Thought Content: Thought content normal.       ED Results / Procedures / Treatments   Labs (all labs ordered are listed, but only abnormal results are displayed) Labs Reviewed  RESPIRATORY PANEL BY RT PCR (FLU A&B, COVID)    EKG None  Radiology DG Forearm Right  Result Date: 05/10/2020 CLINICAL DATA:  Leaning on chair and fell into glass window, 3 inch wound to right forearm, bleeding controlled, dressing placed EXAM: RIGHT FOREARM - 2 VIEW COMPARISON:  Radiograph 01/02/2019 FINDINGS: There is soft tissue irregularity/laceration along the volar  aspect of the right forearm with some additional thickening and laceration extending along the medial and lateral aspects as well. No retained foreign body is seen. Small amount of soft tissue gas is present. No associated osseous injury. IMPRESSION: Soft tissue irregularity/laceration along the volar aspect of the right forearm with additional thickening and laceration extending along the medial and lateral aspects as well. No retained foreign body or osseous injury. Electronically Signed   By: Kreg Shropshire M.D.   On: 05/10/2020 23:19    Procedures Procedures (including critical care time)  Medications Ordered in ED Medications  lidocaine-EPINEPHrine (XYLOCAINE W/EPI) 2 %-1:200000 (PF) injection 20 mL (has no administration in time range)  ketorolac (TORADOL) 30 MG/ML injection 30 mg (30 mg Intramuscular Given 05/10/20 2347)  ceFAZolin (ANCEF) injection 1 g (1 g Intramuscular Given 05/10/20 2347)    ED Course  I have reviewed the triage vital signs and the nursing notes.  Pertinent labs & imaging results that were available during my care of the patient were reviewed by me and considered in my medical decision making (see chart for details).    MDM Rules/Calculators/A&P                          Patient states he is having a lot of pain.  He was given Toradol IM for pain.  He was given Ancef 1 g IM for infection prophylaxis.  Hand consult was placed.  Patient has obvious tendon and muscle mass laceration on the volar aspect of his forearm.  12:40 AM Dr. Orlan Leavens, hand specialist, discussed patient, he wants the patient to remain n.p.o. and he can meet him at 7 AM at [facility], ED.  He wants the patient to be Covid tested before he leaves our ED tonight.  He does not need any more antibiotics before that time.  This information was relayed to the patient.  He was laying on his left side sleeping.  He states he has someone who can drive him to [facility] in the morning.  However patient does  not want me to suture the wound closed.  He states there is no point since he is going to be taken to the OR.   Final Clinical Impression(s) / ED Diagnoses Final diagnoses:  Laceration of flexor tendon of right forearm, initial encounter  Rx / DC Orders ED Discharge Orders    None     Plan discharge  Devoria Albe, MD, Concha Pyo, MD 05/11/20 [phone]

## 2020-05-10 NOTE — ED Triage Notes (Signed)
Pt to er, pt states that he was leaning on a chair and fell into a glass window, pt has aprox 3 inch wound to his R forearm, bleeding is controlled/ stopped at this time, dressing placed.

## 2020-05-11 ENCOUNTER — Ambulatory Visit (HOSPITAL_COMMUNITY)
Admission: RE | Admit: 2020-05-11 | Discharge: 2020-05-11 | Disposition: A | Discharge status: Home / Self Care | Payer: Self-pay | Attending: Orthopedic Surgery | Admitting: Orthopedic Surgery

## 2020-05-11 ENCOUNTER — Encounter (HOSPITAL_COMMUNITY)
Admission: RE | Disposition: A | Discharge status: Home / Self Care | Payer: Self-pay | Source: Home / Self Care | Attending: Orthopedic Surgery

## 2020-05-11 ENCOUNTER — Encounter (HOSPITAL_COMMUNITY): Payer: Self-pay | Admitting: Orthopedic Surgery

## 2020-05-11 ENCOUNTER — Inpatient Hospital Stay (HOSPITAL_COMMUNITY): Payer: Self-pay | Admitting: Anesthesiology

## 2020-05-11 DIAGNOSIS — S66921A Laceration of unspecified muscle, fascia and tendon at wrist and hand level, right hand, initial encounter: Secondary | ICD-10-CM

## 2020-05-11 DIAGNOSIS — S51811A Laceration without foreign body of right forearm, initial encounter: Secondary | ICD-10-CM | POA: Insufficient documentation

## 2020-05-11 DIAGNOSIS — F17210 Nicotine dependence, cigarettes, uncomplicated: Secondary | ICD-10-CM | POA: Insufficient documentation

## 2020-05-11 DIAGNOSIS — W01110A Fall on same level from slipping, tripping and stumbling with subsequent striking against sharp glass, initial encounter: Secondary | ICD-10-CM | POA: Insufficient documentation

## 2020-05-11 HISTORY — PX: TENDON REPAIR: SHX5111

## 2020-05-11 HISTORY — PX: WOUND EXPLORATION: SHX6188

## 2020-05-11 LAB — RESPIRATORY PANEL BY RT PCR (FLU A&B, COVID)
Influenza A by PCR: NEGATIVE
Influenza B by PCR: NEGATIVE
SARS Coronavirus 2 by RT PCR: NEGATIVE

## 2020-05-11 SURGERY — WOUND EXPLORATION
Anesthesia: General | Laterality: Right

## 2020-05-11 MED ORDER — PROMETHAZINE HCL 25 MG/ML IJ SOLN: 6.2500 mg | INTRAMUSCULAR | Status: DC | PRN

## 2020-05-11 MED ORDER — BUPIVACAINE HCL (PF) 0.25 % IJ SOLN
INTRAMUSCULAR | Status: DC | PRN
  Administered 2020-05-11: 20 mL

## 2020-05-11 MED ORDER — CHLORHEXIDINE GLUCONATE 0.12 % MT SOLN
15.0000 mL | Freq: Once | OROMUCOSAL | Status: AC
Start: 2020-05-11 — End: 2020-05-11

## 2020-05-11 MED ORDER — DEXAMETHASONE SODIUM PHOSPHATE 10 MG/ML IJ SOLN
INTRAMUSCULAR | Status: DC | PRN
  Administered 2020-05-11: 4 mg via INTRAVENOUS

## 2020-05-11 MED ORDER — CHLORHEXIDINE GLUCONATE 0.12 % MT SOLN
OROMUCOSAL | Status: AC
  Administered 2020-05-11: 15 mL via OROMUCOSAL
  Filled 2020-05-11: qty 15

## 2020-05-11 MED ORDER — FENTANYL CITRATE (PF) 100 MCG/2ML IJ SOLN
INTRAMUSCULAR | Status: AC
  Administered 2020-05-11: 50 ug via INTRAVENOUS
  Filled 2020-05-11: qty 2

## 2020-05-11 MED ORDER — OXYCODONE-ACETAMINOPHEN 5-325 MG PO TABS
ORAL_TABLET | ORAL | Status: AC
  Filled 2020-05-11: qty 1

## 2020-05-11 MED ORDER — PROPOFOL 10 MG/ML IV BOLUS
INTRAVENOUS | Status: DC | PRN
  Administered 2020-05-11: 200 mg via INTRAVENOUS

## 2020-05-11 MED ORDER — OXYCODONE HCL 5 MG/5ML PO SOLN: 5.0000 mg | Freq: Once | ORAL | Status: AC | PRN

## 2020-05-11 MED ORDER — CEPHALEXIN 500 MG PO CAPS: 500.0000 mg | ORAL_CAPSULE | Freq: Four times a day (QID) | ORAL | 0 refills | Status: AC

## 2020-05-11 MED ORDER — CEFAZOLIN SODIUM-DEXTROSE 2-4 GM/100ML-% IV SOLN
2.0000 g | INTRAVENOUS | Status: AC
  Administered 2020-05-11: 2 g via INTRAVENOUS

## 2020-05-11 MED ORDER — KETOROLAC TROMETHAMINE 30 MG/ML IJ SOLN
30.0000 mg | Freq: Once | INTRAMUSCULAR | Status: DC
Start: 2020-05-11 — End: 2020-05-11

## 2020-05-11 MED ORDER — LIDOCAINE HCL (CARDIAC) PF 100 MG/5ML IV SOSY
PREFILLED_SYRINGE | INTRAVENOUS | Status: DC | PRN
  Administered 2020-05-11: 60 mg via INTRAVENOUS

## 2020-05-11 MED ORDER — OXYCODONE HCL 5 MG PO TABS
5.0000 mg | ORAL_TABLET | Freq: Once | ORAL | Status: AC | PRN
  Administered 2020-05-11: 5 mg via ORAL

## 2020-05-11 MED ORDER — FENTANYL CITRATE (PF) 100 MCG/2ML IJ SOLN
50.0000 ug | INTRAMUSCULAR | Status: DC | PRN
Start: 2020-05-11 — End: 2020-05-11

## 2020-05-11 MED ORDER — ACETAMINOPHEN 10 MG/ML IV SOLN: 1000.0000 mg | Freq: Once | INTRAVENOUS | Status: DC | PRN

## 2020-05-11 MED ORDER — MIDAZOLAM HCL 2 MG/2ML IJ SOLN
INTRAMUSCULAR | Status: AC
  Filled 2020-05-11: qty 2

## 2020-05-11 MED ORDER — OXYCODONE HCL 5 MG PO TABS
ORAL_TABLET | ORAL | Status: AC
  Filled 2020-05-11: qty 1

## 2020-05-11 MED ORDER — LIDOCAINE-EPINEPHRINE (PF) 2 %-1:200000 IJ SOLN: 20.0000 mL | Freq: Once | INTRAMUSCULAR | Status: DC

## 2020-05-11 MED ORDER — HYDROMORPHONE HCL 1 MG/ML IJ SOLN: 0.2500 mg | INTRAMUSCULAR | Status: DC | PRN

## 2020-05-11 MED ORDER — ONDANSETRON HCL 4 MG/2ML IJ SOLN
INTRAMUSCULAR | Status: DC | PRN
  Administered 2020-05-11: 4 mg via INTRAVENOUS

## 2020-05-11 MED ORDER — FENTANYL CITRATE (PF) 250 MCG/5ML IJ SOLN
INTRAMUSCULAR | Status: AC
  Filled 2020-05-11: qty 5

## 2020-05-11 MED ORDER — LACTATED RINGERS IV SOLN
INTRAVENOUS | Status: DC
Start: 2020-05-11 — End: 2020-05-11

## 2020-05-11 MED ORDER — OXYCODONE-ACETAMINOPHEN 10-325 MG PO TABS
1.0000 | ORAL_TABLET | Freq: Four times a day (QID) | ORAL | 0 refills | Status: AC | PRN
Start: 2020-05-11 — End: 2020-05-16

## 2020-05-11 MED ORDER — CEFAZOLIN SODIUM-DEXTROSE 2-4 GM/100ML-% IV SOLN
INTRAVENOUS | Status: AC
  Filled 2020-05-11: qty 100

## 2020-05-11 MED ORDER — FENTANYL CITRATE (PF) 100 MCG/2ML IJ SOLN
INTRAMUSCULAR | Status: DC | PRN
  Administered 2020-05-11 (×3): 25 ug via INTRAVENOUS

## 2020-05-11 SURGICAL SUPPLY — 73 items
BNDG COHESIVE 1X5 TAN STRL LF (GAUZE/BANDAGES/DRESSINGS) IMPLANT
BNDG ELASTIC 3X5.8 VLCR STR LF (GAUZE/BANDAGES/DRESSINGS) ×3 IMPLANT
BNDG ELASTIC 4X5.8 VLCR STR LF (GAUZE/BANDAGES/DRESSINGS) ×3 IMPLANT
BNDG ESMARK 4X9 LF (GAUZE/BANDAGES/DRESSINGS) ×3 IMPLANT
BNDG GAUZE ELAST 4 BULKY (GAUZE/BANDAGES/DRESSINGS) ×3 IMPLANT
CORD BIPOLAR FORCEPS 12FT (ELECTRODE) ×3 IMPLANT
COVER SURGICAL LIGHT HANDLE (MISCELLANEOUS) ×3 IMPLANT
COVER WAND RF STERILE (DRAPES) ×3 IMPLANT
CUFF TOURN SGL QUICK 18X4 (TOURNIQUET CUFF) ×3 IMPLANT
CUFF TOURN SGL QUICK 24 (TOURNIQUET CUFF)
CUFF TRNQT CYL 24X4X16.5-23 (TOURNIQUET CUFF) IMPLANT
DRAPE SURG 17X11 SM STRL (DRAPES) ×6 IMPLANT
DRAPE SURG 17X23 STRL (DRAPES) ×3 IMPLANT
DRSG ADAPTIC 3X8 NADH LF (GAUZE/BANDAGES/DRESSINGS) ×3 IMPLANT
DRSG EMULSION OIL 3X3 NADH (GAUZE/BANDAGES/DRESSINGS) ×3 IMPLANT
DRSG PAD ABDOMINAL 8X10 ST (GAUZE/BANDAGES/DRESSINGS) ×3 IMPLANT
ELECT REM PT RETURN 9FT ADLT (ELECTROSURGICAL) ×3
ELECTRODE REM PT RTRN 9FT ADLT (ELECTROSURGICAL) ×1 IMPLANT
GAUZE SPONGE 2X2 8PLY STRL LF (GAUZE/BANDAGES/DRESSINGS) IMPLANT
GAUZE SPONGE 4X4 12PLY STRL (GAUZE/BANDAGES/DRESSINGS) ×3 IMPLANT
GAUZE XEROFORM 1X8 LF (GAUZE/BANDAGES/DRESSINGS) ×3 IMPLANT
GLOVE BIOGEL PI IND STRL 8.5 (GLOVE) ×1 IMPLANT
GLOVE BIOGEL PI INDICATOR 8.5 (GLOVE) ×2
GLOVE SURG ORTHO 8.0 STRL STRW (GLOVE) ×3 IMPLANT
GOWN STRL REUS W/ TWL LRG LVL3 (GOWN DISPOSABLE) ×2 IMPLANT
GOWN STRL REUS W/ TWL XL LVL3 (GOWN DISPOSABLE) ×1 IMPLANT
GOWN STRL REUS W/TWL LRG LVL3 (GOWN DISPOSABLE) ×4
GOWN STRL REUS W/TWL XL LVL3 (GOWN DISPOSABLE) ×2
IV NS IRRIG 3000ML ARTHROMATIC (IV SOLUTION) ×3 IMPLANT
KIT BASIN OR (CUSTOM PROCEDURE TRAY) ×3 IMPLANT
KIT TURNOVER KIT B (KITS) ×3 IMPLANT
MANIFOLD NEPTUNE II (INSTRUMENTS) ×3 IMPLANT
NEEDLE HYPO 25GX1X1/2 BEV (NEEDLE) IMPLANT
NS IRRIG 1000ML POUR BTL (IV SOLUTION) ×3 IMPLANT
PACK ORTHO EXTREMITY (CUSTOM PROCEDURE TRAY) ×3 IMPLANT
PAD ARMBOARD 7.5X6 YLW CONV (MISCELLANEOUS) ×6 IMPLANT
PAD CAST 3X4 CTTN HI CHSV (CAST SUPPLIES) ×1 IMPLANT
PAD CAST 4YDX4 CTTN HI CHSV (CAST SUPPLIES) ×1 IMPLANT
PADDING CAST COTTON 3X4 STRL (CAST SUPPLIES) ×2
PADDING CAST COTTON 4X4 STRL (CAST SUPPLIES) ×2
SET CYSTO W/LG BORE CLAMP LF (SET/KITS/TRAYS/PACK) IMPLANT
SOAP 2 % CHG 4 OZ (WOUND CARE) ×3 IMPLANT
SPECIMEN JAR SMALL (MISCELLANEOUS) ×3 IMPLANT
SPLINT FIBERGLASS 3X35 (CAST SUPPLIES) ×3 IMPLANT
SPONGE GAUZE 2X2 STER 10/PKG (GAUZE/BANDAGES/DRESSINGS)
SUCTION FRAZIER HANDLE 10FR (MISCELLANEOUS)
SUCTION TUBE FRAZIER 10FR DISP (MISCELLANEOUS) IMPLANT
SUT ETHILON 8 0 BV130 4 (SUTURE) IMPLANT
SUT ETHILON 9 0 BV130 4 (SUTURE) IMPLANT
SUT FIBER WIRE 4.0 (SUTURE) IMPLANT
SUT FIBERWIRE 2-0 18 17.9 3/8 (SUTURE)
SUT FIBERWIRE 3-0 18 TAPR NDL (SUTURE) ×9
SUT MERSILENE 4 0 P 3 (SUTURE) IMPLANT
SUT PROLENE 4 0 PS 2 18 (SUTURE) IMPLANT
SUT PROLENE 5 0 PS 2 (SUTURE) IMPLANT
SUT PROLENE 6 0 P 1 18 (SUTURE) IMPLANT
SUT VIC AB 1 CT1 27 (SUTURE)
SUT VIC AB 1 CT1 27XBRD ANTBC (SUTURE) IMPLANT
SUT VIC AB 2-0 CT1 27 (SUTURE)
SUT VIC AB 2-0 CT1 27XBRD (SUTURE) IMPLANT
SUT VIC AB 2-0 CT1 TAPERPNT 27 (SUTURE) IMPLANT
SUTURE FIBERWR 2-0 18 17.9 3/8 (SUTURE) IMPLANT
SUTURE FIBERWR 3-0 18 TAPR NDL (SUTURE) ×3 IMPLANT
SYR 20ML LL LF (SYRINGE) IMPLANT
SYR BULB IRRIG 60ML STRL (SYRINGE) ×3 IMPLANT
SYR CONTROL 10ML LL (SYRINGE) IMPLANT
TOWEL GREEN STERILE (TOWEL DISPOSABLE) ×3 IMPLANT
TOWEL GREEN STERILE FF (TOWEL DISPOSABLE) ×3 IMPLANT
TUBE CONNECTING 12'X1/4 (SUCTIONS)
TUBE CONNECTING 12X1/4 (SUCTIONS) IMPLANT
TUBE FEEDING ENTERAL 5FR 16IN (TUBING) IMPLANT
UNDERPAD 30X36 HEAVY ABSORB (UNDERPADS AND DIAPERS) ×3 IMPLANT
WATER STERILE IRR 1000ML POUR (IV SOLUTION) ×3 IMPLANT

## 2020-05-11 NOTE — Discharge Instructions (Signed)
KEEP BANDAGE CLEAN AND DRY CALL OFFICE FOR F/U APPT [phone] in 2 weeks KEEP HAND ELEVATED ABOVE HEART OK TO APPLY ICE TO OPERATIVE AREA CONTACT OFFICE IF ANY WORSENING PAIN OR CONCERNS.  

## 2020-05-11 NOTE — Anesthesia Preprocedure Evaluation (Addendum)
Anesthesia Evaluation  Patient identified by MRN, date of birth, ID band Patient awake    Reviewed: Allergy & Precautions, NPO status , Patient's Chart, lab work & pertinent test results  Airway Mallampati: I  TM Distance: >3 FB Neck ROM: Full    Dental  (+) Poor Dentition, Chipped, Dental Advisory Given,    Pulmonary Current SmokerPatient did not abstain from smoking.,    Pulmonary exam normal breath sounds clear to auscultation       Cardiovascular negative cardio ROS Normal cardiovascular exam Rhythm:Regular Rate:Normal     Neuro/Psych negative neurological ROS  negative psych ROS   GI/Hepatic negative GI ROS, Neg liver ROS,   Endo/Other  negative endocrine ROS  Renal/GU negative Renal ROS     Musculoskeletal negative musculoskeletal ROS (+)   Abdominal   Peds  Hematology negative hematology ROS (+)   Anesthesia Other Findings ARM LACERATION  Reproductive/Obstetrics                          Anesthesia Physical Anesthesia Plan  ASA: II and emergent  Anesthesia Plan: General   Post-op Pain Management:    Induction: Intravenous  PONV Risk Score and Plan: 1 and Ondansetron, Dexamethasone, Midazolam and Treatment may vary due to age or medical condition  Airway Management Planned: LMA  Additional Equipment:   Intra-op Plan:   Post-operative Plan: Extubation in OR  Informed Consent: I have reviewed the patients History and Physical, chart, labs and discussed the procedure including the risks, benefits and alternatives for the proposed anesthesia with the patient or authorized representative who has indicated his/her understanding and acceptance.     Dental advisory given  Plan Discussed with: CRNA  Anesthesia Plan Comments:       Anesthesia Quick Evaluation
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
## 2020-05-11 NOTE — H&P (Addendum)
Ricardo Tate is an 34 y.o. male.   Chief Complaint: RIGHT FOREARM LACERATION HPI:   Patient states just prior to arrival he was outside standing on a chair changing a window pain in a window of his house and the chair started sinking into the ground and he fell forward and his arm went through a window pain and broke it.  He denies any numbness of his fingers.  Patient is right-handed.  He states he had to walk about a half a mile to his uncle's house who brought him to the ED.  He states his last tetanus was about 5 years ago.   Past Medical History:  Diagnosis Date   Right orbital fracture (HCC)    Skin abscess    Tobacco abuse     Past Surgical History:  Procedure Laterality Date   TUMOR REMOVAL     right hip when he was 34yrs old    Family History  Problem Relation Age of Onset   Hypertension Other    Diabetes Mother    Asthma Brother    Social History:  reports that he has been smoking cigarettes. He has been smoking about 0.50 packs per day. He has never used smokeless tobacco. He reports current alcohol use. He reports that he does not use drugs.  Allergies: No Known Allergies  Medications Prior to Admission  Medication Sig Dispense Refill   ibuprofen (ADVIL) 800 MG tablet Take 1 tablet (800 mg total) by mouth 3 (three) times daily. 21 tablet 0    Results for orders placed or performed during the hospital encounter of 05/10/20 (from the past 48 hour(s))  Respiratory Panel by RT PCR (Flu A&B, Covid) - Nasopharyngeal Swab     Status: None   Collection Time: 05/11/20 12:49 AM   Specimen: Nasopharyngeal Swab  Result Value Ref Range   SARS Coronavirus 2 by RT PCR NEGATIVE NEGATIVE    Comment: (NOTE) SARS-CoV-2 target nucleic acids are NOT DETECTED.  The SARS-CoV-2 RNA is generally detectable in upper respiratoy specimens during the acute phase of infection. The lowest concentration of SARS-CoV-2 viral copies this assay can detect is 131 copies/mL. A negative  result does not preclude SARS-Cov-2 infection and should not be used as the sole basis for treatment or other patient management decisions. A negative result may occur with  improper specimen collection/handling, submission of specimen other than nasopharyngeal swab, presence of viral mutation(s) within the areas targeted by this assay, and inadequate number of viral copies (<131 copies/mL). A negative result must be combined with clinical observations, patient history, and epidemiological information. The expected result is Negative.  Fact Sheet for Patients:  https://www.moore.com/  Fact Sheet for Healthcare Providers:  https://www.young.biz/  This test is no t yet approved or cleared by the [place] FDA and  has been authorized for detection and/or diagnosis of SARS-CoV-2 by FDA under an Emergency Use Authorization (EUA). This EUA will remain  in effect (meaning this test can be used) for the duration of the COVID-19 declaration under Section 564(b)(1) of the Act, 21 U.S.C. section 360bbb-3(b)(1), unless the authorization is terminated or revoked sooner.     Influenza A by PCR NEGATIVE NEGATIVE   Influenza B by PCR NEGATIVE NEGATIVE    Comment: (NOTE) The Xpert Xpress SARS-CoV-2/FLU/RSV assay is intended as an aid in  the diagnosis of influenza from Nasopharyngeal swab specimens and  should not be used as a sole basis for treatment. Nasal washings and  aspirates are unacceptable for  Xpert Xpress SARS-CoV-2/FLU/RSV  testing.  Fact Sheet for Patients: https://www.moore.com/  Fact Sheet for Healthcare Providers: https://www.young.biz/  This test is not yet approved or cleared by the [place] FDA and  has been authorized for detection and/or diagnosis of SARS-CoV-2 by  FDA under an Emergency Use Authorization (EUA). This EUA will remain  in effect (meaning this test can be used) for the  duration of the  Covid-19 declaration under Section 564(b)(1) of the Act, 21  U.S.C. section 360bbb-3(b)(1), unless the authorization is  terminated or revoked. Performed at [facility], [address], [place], [place] [address]    DG Forearm Right  Result Date: 05/10/2020 CLINICAL DATA:  Leaning on chair and fell into glass window, 3 inch wound to right forearm, bleeding controlled, dressing placed EXAM: RIGHT FOREARM - 2 VIEW COMPARISON:  Radiograph 01/02/2019 FINDINGS: There is soft tissue irregularity/laceration along the volar aspect of the right forearm with some additional thickening and laceration extending along the medial and lateral aspects as well. No retained foreign body is seen. Small amount of soft tissue gas is present. No associated osseous injury. IMPRESSION: Soft tissue irregularity/laceration along the volar aspect of the right forearm with additional thickening and laceration extending along the medial and lateral aspects as well. No retained foreign body or osseous injury. Electronically Signed   By: Kreg Shropshire M.D.   On: 05/10/2020 23:19    ROS  A COMPREHENSIVE TEN SYSTEM REVIEW OF SYSTEMS WAS COMPLETED AND ALL OTHER ORGAN SYSTEMS WERE NEGATIVE. THE PATIENT HAS NOT HAD ANY RECENT ILLNESS OR HOSPITALIZATIONS.   There were no vitals taken for this visit. Physical Exam   General Appearance:  Alert, cooperative, no distress, appears stated age  Head:  Normocephalic, without obvious abnormality, atraumatic  Eyes:  Pupils equal, conjunctiva/corneas clear,         Throat: Lips, mucosa, and tongue normal; teeth and gums normal  Neck: No visible masses     Lungs:   respirations unlabored  Chest Wall:  No tenderness or deformity  Heart:  Regular rate and rhythm,  Abdomen:   Soft, non-tender,         Extremities: RIGHT UE: BANDAGE IN PLACE, PICTURE IN CHART, ABLE TO FLEX DIP JOINTS OF ALL FINGERS, ABLE TO FLEX PIP JOINTS OF ALL FINGERS BUT WEAK, POOR WRIST  FLEXION SENSATION PRESENT TO MEDIAN NERVE  ABLE TO FLEX THUMB IP JOINT  Pulses: 2+ and symmetric  Skin: Skin color, texture, turgor normal, no rashes or lesions     Neurologic: Normal    Assessment/Plan RIGHT FOREARM LACERATION WITH TENDON INVOLVEMENT  RIGHT FOREARM WOUND EXPLORATION AND TENDON AND NERVE AND ARTERY REPAIR AS INDICATED  R/B/A DISCUSSED WITH PT IN OFFICE.  PT VOICED UNDERSTANDING OF PLAN CONSENT SIGNED DAY OF SURGERY PT SEEN AND EXAMINED PRIOR TO OPERATIVE PROCEDURE/DAY OF SURGERY SITE MARKED. QUESTIONS ANSWERED WILL GO HOME FOLLOWING SURGERY  WE ARE PLANNING SURGERY FOR YOUR UPPER EXTREMITY. THE RISKS AND BENEFITS OF SURGERY INCLUDE BUT NOT LIMITED TO BLEEDING INFECTION, DAMAGE TO NEARBY NERVES ARTERIES TENDONS, FAILURE OF SURGERY TO ACCOMPLISH ITS INTENDED GOALS, PERSISTENT SYMPTOMS AND NEED FOR FURTHER SURGICAL INTERVENTION. WITH THIS IN MIND WE WILL PROCEED. I HAVE DISCUSSED WITH THE PATIENT THE PRE AND POSTOPERATIVE REGIMEN AND THE DOS AND DON'TS. PT VOICED UNDERSTANDING AND INFORMED CONSENT SIGNED.  Ricardo Tate 05/11/2020, 8:02 AM

## 2020-05-11 NOTE — ED Notes (Signed)
PT unable to tolerate ICE to right forearm. Pt educated on need to immobilize and reduce swelling. Pt verbalized complete understanding.

## 2020-05-11 NOTE — Transfer of Care (Signed)
Immediate Anesthesia Transfer of Care Note  Patient: Ricardo Tate  Procedure(s) Performed: WOUND EXPLORATION FOREARM (Right ) TENDON REPAIR (Right )  Patient Location: PACU  Anesthesia Type:General  Level of Consciousness: awake, alert , oriented and patient cooperative  Airway & Oxygen Therapy: Patient Spontanous Breathing  Post-op Assessment: Report given to RN and Post -op Vital signs reviewed and stable  Post vital signs: Reviewed and stable  Last Vitals:  Vitals Value Taken Time  BP 138/90 05/11/20 1021  Temp    Pulse 106 05/11/20 1022  Resp 13 05/11/20 1022  SpO2 99 % 05/11/20 1022  Vitals shown include unvalidated device data.  Last Pain:  Vitals:   05/11/20 0907  TempSrc:   PainSc: 6       Patients Stated Pain Goal: 3 (05/11/20 0805)  Complications: No complications documented.

## 2020-05-11 NOTE — Anesthesia Postprocedure Evaluation (Signed)
Anesthesia Post Note  Patient: Ricardo Tate  Procedure(s) Performed: WOUND EXPLORATION FOREARM (Right ) TENDON REPAIR (Right )     Patient location during evaluation: PACU Anesthesia Type: General Level of consciousness: awake and alert Pain management: pain level controlled Vital Signs Assessment: post-procedure vital signs reviewed and stable Respiratory status: spontaneous breathing, nonlabored ventilation, respiratory function stable and patient connected to nasal cannula oxygen Cardiovascular status: blood pressure returned to baseline and stable Postop Assessment: no apparent nausea or vomiting Anesthetic complications: no   No complications documented.  Last Vitals:  Vitals:   05/11/20 1035 05/11/20 1050  BP: 129/84 127/78  Pulse: 90 74  Resp: 16 17  Temp:    SpO2: 99% 98%    Last Pain:  Vitals:   05/11/20 1050  TempSrc:   PainSc: 2                  Verle Wheeling P Analilia Geddis

## 2020-05-11 NOTE — Anesthesia Procedure Notes (Addendum)
Procedure Name: LMA Insertion Date/Time: 05/11/2020 9:17 AM Performed by: Sonda Primes, CRNA Pre-anesthesia Checklist: Patient identified, Emergency Drugs available, Suction available and Patient being monitored Patient Re-evaluated:Patient Re-evaluated prior to induction Oxygen Delivery Method: Circle System Utilized Preoxygenation: Pre-oxygenation with 100% oxygen Induction Type: IV induction Ventilation: Mask ventilation without difficulty LMA: LMA inserted LMA Size: 5.0 Number of attempts: 1 Airway Equipment and Method: Bite block Placement Confirmation: positive ETCO2 Tube secured with: Tape Dental Injury: Teeth and Oropharynx as per pre-operative assessment

## 2020-05-11 NOTE — Discharge Instructions (Addendum)
Do not eat or drink before you go to [facility] ED this morning at 7 am to meet Dr Orlan Leavens, a hand specialist, to fix the tendons in your arm. You did not want me to close the wound tonight. Do not remove the dressing.

## 2020-05-11 NOTE — ED Notes (Signed)
Pt tolerated med admin well. Pt resting on stretcher, VSS NAD PT remains a/o x 4. Occlusive wet dressing applied to right forearm laceration, bleeding controled at this time. PT continues to c/o of pain 8/10 ache in nature as result of injury.

## 2020-05-11 NOTE — ED Notes (Signed)
PT refused to have laceration closed at bedside by Dr. Lynelle Doctor. Pt was educated as to the severity of injury and need to follow up with Ortho Surgeon. Pt verbalized complete understanding and denies questions at this time. Pt was able to self ambulate to exit with steady gait, family to transport.

## 2020-05-12 ENCOUNTER — Encounter (HOSPITAL_COMMUNITY): Payer: Self-pay | Admitting: Orthopedic Surgery
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
## 2021-02-06 ENCOUNTER — Ambulatory Visit (HOSPITAL_COMMUNITY): Payer: Self-pay | Admitting: Licensed Clinical Social Worker
